# Patient Record
Sex: Female | Born: 1970 | Hispanic: Yes | Marital: Married | Smoking: Never smoker
Health system: Southern US, Community
[De-identification: ages and names within clinical notes are randomized; demographics above are authoritative.]

## PROBLEM LIST (undated history)

## (undated) DIAGNOSIS — I10 Essential (primary) hypertension: Secondary | ICD-10-CM

## (undated) DIAGNOSIS — Z87442 Personal history of urinary calculi: Secondary | ICD-10-CM

---

## 2000-09-15 ENCOUNTER — Ambulatory Visit (HOSPITAL_COMMUNITY): Admission: RE | Admit: 2000-09-15 | Discharge: 2000-09-15 | Payer: Self-pay | Admitting: *Deleted

## 2000-10-18 ENCOUNTER — Inpatient Hospital Stay (HOSPITAL_COMMUNITY): Admission: AD | Admit: 2000-10-18 | Discharge: 2000-10-18 | Payer: Self-pay | Admitting: *Deleted

## 2000-10-21 ENCOUNTER — Ambulatory Visit (HOSPITAL_COMMUNITY): Admission: RE | Admit: 2000-10-21 | Discharge: 2000-10-21 | Payer: Self-pay | Admitting: *Deleted

## 2000-10-25 ENCOUNTER — Emergency Department (HOSPITAL_COMMUNITY): Admission: EM | Admit: 2000-10-25 | Discharge: 2000-10-25 | Payer: Self-pay | Admitting: Emergency Medicine

## 2000-10-25 ENCOUNTER — Encounter: Payer: Self-pay | Admitting: Emergency Medicine

## 2000-12-17 ENCOUNTER — Encounter: Payer: Self-pay | Admitting: *Deleted

## 2000-12-17 ENCOUNTER — Inpatient Hospital Stay (HOSPITAL_COMMUNITY): Admission: AD | Admit: 2000-12-17 | Discharge: 2000-12-17 | Payer: Self-pay | Admitting: *Deleted

## 2000-12-18 ENCOUNTER — Inpatient Hospital Stay (HOSPITAL_COMMUNITY): Admission: AD | Admit: 2000-12-18 | Discharge: 2000-12-20 | Payer: Self-pay | Admitting: *Deleted

## 2000-12-28 ENCOUNTER — Encounter (HOSPITAL_COMMUNITY): Admission: RE | Admit: 2000-12-28 | Discharge: 2001-01-27 | Payer: Self-pay | Admitting: Obstetrics & Gynecology

## 2000-12-29 ENCOUNTER — Encounter: Admission: RE | Admit: 2000-12-29 | Discharge: 2000-12-29 | Payer: Self-pay | Admitting: Obstetrics & Gynecology

## 2001-01-04 ENCOUNTER — Encounter: Admission: RE | Admit: 2001-01-04 | Discharge: 2001-01-04 | Payer: Self-pay | Admitting: Obstetrics & Gynecology

## 2001-01-18 ENCOUNTER — Encounter: Admission: RE | Admit: 2001-01-18 | Discharge: 2001-01-18 | Payer: Self-pay | Admitting: Obstetrics & Gynecology

## 2001-01-25 ENCOUNTER — Encounter: Admission: RE | Admit: 2001-01-25 | Discharge: 2001-01-25 | Payer: Self-pay | Admitting: Obstetrics & Gynecology

## 2001-01-31 ENCOUNTER — Inpatient Hospital Stay (HOSPITAL_COMMUNITY): Admission: AD | Admit: 2001-01-31 | Discharge: 2001-02-02 | Payer: Self-pay | Admitting: Obstetrics

## 2003-02-05 ENCOUNTER — Emergency Department (HOSPITAL_COMMUNITY): Admission: EM | Admit: 2003-02-05 | Discharge: 2003-02-05 | Payer: Self-pay | Admitting: Emergency Medicine

## 2004-09-26 ENCOUNTER — Emergency Department (HOSPITAL_COMMUNITY): Admission: EM | Admit: 2004-09-26 | Discharge: 2004-09-26 | Payer: Self-pay | Admitting: Emergency Medicine

## 2006-10-17 ENCOUNTER — Encounter: Admission: RE | Admit: 2006-10-17 | Discharge: 2006-10-17 | Payer: Self-pay | Admitting: Family Medicine

## 2007-04-05 ENCOUNTER — Encounter: Admission: RE | Admit: 2007-04-05 | Discharge: 2007-04-05 | Payer: Self-pay | Admitting: Family Medicine

## 2007-10-04 ENCOUNTER — Encounter: Admission: RE | Admit: 2007-10-04 | Discharge: 2007-10-04 | Payer: Self-pay | Admitting: Internal Medicine

## 2010-07-12 ENCOUNTER — Encounter: Payer: Self-pay | Admitting: *Deleted

## 2010-11-06 NOTE — Discharge Summary (Signed)
[facility]  Patient:    Joy [name], Joy [name]            MRN: [id number] Adm. Date:  [id number] Disc. Date: [id number] Attending:  Michaelle Copas Dictator:   Jonah Blue, M.D. CC:         [facility]   Discharge Summary  DATE OF BIRTH:                03-Nov-1970  ADMISSION DIAGNOSES:          Questionable marginal separation of the placenta, breech ______, previous cesarean section x 2, vaginal bleeding.  DISCHARGE DIAGNOSES:          Questionable marginal separation of the placenta, breech ______, previous cesarean section x 2, vaginal bleeding resolved.  HISTORY:                      Patient is a 40 year old G3, P2-0-0-2 who presents at 20 and [redacted] weeks gestation with concerning vaginal bleeding and contractions, but good fetal movement.  Throughout patients hospital course vaginal bleeding ended at 8 a.m. on December 19, 2000 and has resolved since. Uterine irritability has continued, but without any further contractions. Patient is having no other symptoms and is doing well.  LABORATORIES:                 Normal CBC with white blood count 7.9, hemoglobin 12.4, platelets 242,000.  DIC panel showed PT 13.2, PTT 30, INR 1.0, fibrinogen 558, D-dimer 1.38.  PAST OBSTETRICAL HISTORY:    Birth by cesarean section in 12 in [place], indications unknown.  Birth by cesarean section in 70 in [place]. Indications for cesarean section unknown.  PAST GYNECOLOGICAL HISTORY:   Significant for frequent UTIs.  PAST SURGICAL HISTORY:        None.  PRENATAL LABORATORIES:        Blood type O+.  Antibody screen negative. Hepatitis B surface antigen negative.  Syphilis negative.  HIV negative.  GC and chlamydia negative.  GBS positive on May 9.  One hour GTT 82.  FAMILY HISTORY:               Noncontributory.  SOCIAL HISTORY:               Noncontributory.  PHYSICAL EXAMINATION  GENERAL:                      A woman in no acute  distress.  VITAL SIGNS:                  Stable.  Temperature 98.5, pulse 68, respirations 18, blood pressure 132/72.  HEART:                        Regular rate and rhythm without murmur.  LUNGS:                        Clear to auscultation bilaterally.  ABDOMEN:                      Soft, nontender with fundal height approximately 30 cm.  EXTREMITIES:                  No edema.  NEUROLOGIC:                   DTRs 1+.  PELVIC:  Speculum examination showed dark red blood swabbed from the cervix, but without any active bleeding.  Also, a thin discharge.  Digital cervical examination showed the cervix to be closed, long, and high.  Fetal heart rate at that time was in the 140s and was reactive with no variability and no decelerations.  The contractions were occasional with uterine irritability and the duration was 20-60 seconds of a mild intensity.  HOSPITAL COURSE:              VAGINAL BLEEDING:  Resolved.  No complications or infections and no further concerns developed.  CONDITION ON DISCHARGE:       Improved.  DISPOSITION:                  Patient is to go home on bed rest.  She is to avoid prolonged standing, but can get up for bathroom and meals, but should not cook or shop.  Patient is to avoid sexual intercourse.  She should also count kick counts by laying on her left side and counting 10 counts in an hour.  If she does not reach 10 counts in an hour she should get up, drink some juice, and walk around for a little while and then lay back down and attempt to count 30 counts in half an hour.  If she is unsuccessful at that she should call her physician or return to clinic.  Patient should also return to clinic immediately if vaginal bleeding recurs.  Anticipated plan is for a cesarean section scheduled at 39 weeks.  Patient is to follow-up at high risk clinic in one week.  Appointment is set for December 28, 2000 at 0830.DD:  12/20/00 TD:  12/20/00 Job:  [id number]

## 2012-09-09 ENCOUNTER — Encounter (HOSPITAL_COMMUNITY): Payer: Self-pay | Admitting: *Deleted

## 2012-09-09 ENCOUNTER — Emergency Department (HOSPITAL_COMMUNITY)
Admission: EM | Admit: 2012-09-09 | Discharge: 2012-09-09 | Disposition: A | Discharge status: Home / Self Care | Payer: No Typology Code available for payment source | Attending: Emergency Medicine | Admitting: Emergency Medicine

## 2012-09-09 ENCOUNTER — Emergency Department (HOSPITAL_COMMUNITY): Payer: No Typology Code available for payment source

## 2012-09-09 DIAGNOSIS — IMO0002 Reserved for concepts with insufficient information to code with codable children: Secondary | ICD-10-CM | POA: Insufficient documentation

## 2012-09-09 DIAGNOSIS — S0990XA Unspecified injury of head, initial encounter: Secondary | ICD-10-CM | POA: Insufficient documentation

## 2012-09-09 DIAGNOSIS — Z79899 Other long term (current) drug therapy: Secondary | ICD-10-CM | POA: Insufficient documentation

## 2012-09-09 DIAGNOSIS — T148XXA Other injury of unspecified body region, initial encounter: Secondary | ICD-10-CM | POA: Insufficient documentation

## 2012-09-09 DIAGNOSIS — S46909A Unspecified injury of unspecified muscle, fascia and tendon at shoulder and upper arm level, unspecified arm, initial encounter: Secondary | ICD-10-CM | POA: Insufficient documentation

## 2012-09-09 DIAGNOSIS — Y92410 Unspecified street and highway as the place of occurrence of the external cause: Secondary | ICD-10-CM | POA: Insufficient documentation

## 2012-09-09 DIAGNOSIS — S4980XA Other specified injuries of shoulder and upper arm, unspecified arm, initial encounter: Secondary | ICD-10-CM | POA: Insufficient documentation

## 2012-09-09 DIAGNOSIS — Y93I9 Activity, other involving external motion: Secondary | ICD-10-CM | POA: Insufficient documentation

## 2012-09-09 MED ORDER — HYDROCODONE-ACETAMINOPHEN 5-325 MG PO TABS: 1.0000 | ORAL_TABLET | ORAL | Status: DC | PRN

## 2012-09-09 MED ORDER — CYCLOBENZAPRINE HCL 5 MG PO TABS: 10.0000 mg | ORAL_TABLET | Freq: Three times a day (TID) | ORAL | Status: DC | PRN

## 2012-09-09 MED ORDER — HYDROCODONE-ACETAMINOPHEN 5-325 MG PO TABS
1.0000 | ORAL_TABLET | Freq: Once | ORAL | Status: AC
  Administered 2012-09-09: 1 via ORAL
  Filled 2012-09-09: qty 1

## 2012-09-09 MED ORDER — CYCLOBENZAPRINE HCL 10 MG PO TABS
5.0000 mg | ORAL_TABLET | Freq: Once | ORAL | Status: AC
  Administered 2012-09-09: 5 mg via ORAL
  Filled 2012-09-09: qty 1

## 2012-09-09 NOTE — ED Notes (Signed)
Patient transported to CT 

## 2012-09-09 NOTE — ED Notes (Signed)
Transporter back from CT to FT-08.
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
## 2012-09-09 NOTE — ED Notes (Signed)
Patient is alert and orientedx4.  Patient was explained discharge instructions and they understood them with no questions.  The patient's niece, Gearlean Alf is taking the patient home.

## 2012-09-11 ENCOUNTER — Emergency Department (HOSPITAL_COMMUNITY)
Admission: EM | Admit: 2012-09-11 | Discharge: 2012-09-11 | Disposition: A | Discharge status: Home / Self Care | Payer: No Typology Code available for payment source | Attending: Emergency Medicine | Admitting: Emergency Medicine

## 2012-09-11 ENCOUNTER — Encounter (HOSPITAL_COMMUNITY): Payer: Self-pay | Admitting: Emergency Medicine

## 2012-09-11 DIAGNOSIS — Y9389 Activity, other specified: Secondary | ICD-10-CM | POA: Insufficient documentation

## 2012-09-11 DIAGNOSIS — Z79899 Other long term (current) drug therapy: Secondary | ICD-10-CM | POA: Insufficient documentation

## 2012-09-11 DIAGNOSIS — Y92410 Unspecified street and highway as the place of occurrence of the external cause: Secondary | ICD-10-CM | POA: Insufficient documentation

## 2012-09-11 DIAGNOSIS — M542 Cervicalgia: Secondary | ICD-10-CM

## 2012-09-11 DIAGNOSIS — S0993XA Unspecified injury of face, initial encounter: Secondary | ICD-10-CM | POA: Insufficient documentation

## 2012-09-11 DIAGNOSIS — S199XXA Unspecified injury of neck, initial encounter: Secondary | ICD-10-CM | POA: Insufficient documentation

## 2012-09-11 MED ORDER — KETOROLAC TROMETHAMINE 60 MG/2ML IM SOLN
60.0000 mg | Freq: Once | INTRAMUSCULAR | Status: AC
  Administered 2012-09-11: 60 mg via INTRAMUSCULAR
  Filled 2012-09-11: qty 2

## 2012-09-11 MED ORDER — DIAZEPAM 5 MG PO TABS
5.0000 mg | ORAL_TABLET | Freq: Once | ORAL | Status: AC
  Administered 2012-09-11: 5 mg via ORAL
  Filled 2012-09-11: qty 1
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
## 2012-09-12 NOTE — ED Provider Notes (Signed)
Medical screening examination/treatment/procedure(s) were performed by non-physician practitioner and as supervising physician I was immediately available for consultation/collaboration.  Flint Melter, MD 09/12/12 Ebony Cargo
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
## 2012-09-21 NOTE — ED Provider Notes (Signed)
Medical screening examination/treatment/procedure(s) were performed by non-physician practitioner and as supervising physician I was immediately available for consultation/collaboration.  Sunnie Nielsen, MD 09/21/12 2258
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
## 2019-07-26 ENCOUNTER — Ambulatory Visit (INDEPENDENT_AMBULATORY_CARE_PROVIDER_SITE_OTHER): Payer: Self-pay | Admitting: Women's Health

## 2019-07-26 ENCOUNTER — Other Ambulatory Visit: Payer: Self-pay

## 2019-07-26 ENCOUNTER — Encounter: Payer: Self-pay | Admitting: Women's Health

## 2019-07-26 VITALS — BP 122/80 | Ht 62.0 in | Wt 173.0 lb

## 2019-07-26 DIAGNOSIS — N83201 Unspecified ovarian cyst, right side: Secondary | ICD-10-CM

## 2019-07-26 MED ORDER — NORETHIN ACE-ETH ESTRAD-FE 1-20 MG-MCG PO TABS: 1.0000 | ORAL_TABLET | Freq: Every day | ORAL | 12 refills | Status: DC

## 2019-07-26 MED ORDER — NORGESTIMATE-ETH ESTRADIOL 0.25-35 MG-MCG PO TABS: 1.0000 | ORAL_TABLET | Freq: Every day | ORAL | 11 refills | Status: DC

## 2019-07-26 NOTE — Patient Instructions (Addendum)
Vit D 2000 iu daily [place]  daugter to get Gardasil  Quiste ovrico (Ovarian Cyst) Un quiste ovrico es una bolsa llena de lquido que se forma en el ovario. Los ovarios son los rganos pequeos que producen vulos en las mujeres. Se pueden formar varios tipos de [employer]. Algunos pueden provocar sntomas y requerir Clinical research associate. La mayora de los quistes ovricos desaparecen por s solos, no son cancerosos (son benignos) y no causan problemas. Los tipos ms comunes de quistes ovricos son los siguientes:  Quistes funcionales (folculos). ? Ocurren durante el ciclo menstrual y suelen desaparecer con el siguiente ciclo menstrual si no queda embarazada. ? No suelen causar sntomas.  Endometriomas. ? Son quistes formados por el tejido que recubre el tero (endometrio). ? A veces se denominan "quistes de chocolate" porque se llenan de sangre que se vuelve marrn. ? Este tipo de quiste puede provocar dolor en la zona inferior del abdomen durante la relacin sexual y el perodo menstrual.  Cistoadenomas. ? Se desarrollan a partir de clulas que se encuentran en la superficie externa del ovario. ? Pueden agrandarse mucho y causar dolor en la zona inferior del abdomen y con la relacin sexual. ? Pueden provocar dolor intenso si se tuercen o se rompen (ruptura).  Quistes dermoides. ? A veces se encuentran en ambos ovarios. ? Estos quistes pueden [employer]J's tipos de tejidos del organismo, como piel, dientes, pelo o Database administrator. ? Generalmente no tienen sntomas, a menos que sean muy grandes.  Quistes tecalutesticos. ? Aparecen cuando se produce demasiada cantidad de cierta hormona (gonadotropina corinica humana) que estimula en exceso al ovario para que produzca vulos. ? Son ms frecuentes despus de procedimientos que ayudan a la concepcin de un beb (fertilizacin in vitro). CAUSAS Algunas de las causas de los quistes ovricos son las siguientes:  Sndrome de  hiperestimulacin ovrica. Esta es una afeccin que puede aparecer debido a la toma de medicamentos para la fertilidad. Provoca la formacin de varios quistes ovricos grandes.  Sndrome de ovario poliqustico (SOP). Este es un trastorno hormonal frecuente que puede producir quistes ovricos, adems de problemas en el perodo o la fertilidad. [place] factores pueden hacer que usted sea ms propensa a desarrollar quistes ovricos:  Tener exceso de [place] u obesidad.  Tomar medicamentos para la fertilidad.  Usar ciertas formas de regulacin hormonal de la natalidad.  Fumar. SNTOMAS Muchos quistes ovricos no causan sntomas. Si se presentan sntomas, estos pueden ser:  Dolor o molestias en la pelvis.  Dolor en la parte baja del abdomen.  [place].  [place]inch[place]n abdominal.  Perodos menstruales anormales.  Aumento del [employer] perodos [place]. DIAGNSTICO Estos quistes se descubren comnmente durante un examen de rutina o una exploracin ginecolgica. Puede realizarse exmenes para obtener ms informacin sobre el [place], como los siguientes:  Magazine features editor.  Radiografas de la pelvis.  Tomografa computarizada (TC).  Resonancia magntica (RM).  Anlisis de [place]. TRATAMIENTO Muchos de los quistes ovricos desaparecen por s solos, sin tratamiento. Es probable que el mdico quiera controlar el quiste regularmente durante 2 o 63meses para ver si se produce algn cambio. Si est en la menopausia, es especialmente importante controlar el quiste cuidadosamente porque las mujeres menopusicas tienen un ndice mayor de cncer de ovario. Si se necesita tratamiento, este puede incluir lo siguiente:  Tomar medicamentos para Best boy.  Un procedimiento para drenar el quiste (aspiracin).  Ciruga para extirpar el quiste completo.  Tratamiento hormonal o pldoras  anticonceptivas. Estos mtodos a veces se usan para  ayudar a Writer. [place] los medicamentos de venta libre y los recetados solamente como se lo haya indicado el mdico.  No conduzca ni use maquinaria pesada mientras toma analgsicos recetados.  Realcese exmenes plvicos peridicos y pruebas de Papanicolaou con la frecuencia que le indique el mdico.  Retome sus actividades normales como se lo haya indicado el mdico. Pregntele al mdico qu actividades son seguras para usted.  No consuma ningn producto que contenga tabaco o nicotina, como cigarrillos y Psychologist, sport and exercise. Si necesita ayuda para dejar de fumar, consulte al mdico.  Concurra a todas las visitas de control como se lo haya indicado el mdico. Esto es importante. SOLICITE ATENCIN MDICA SI:  Los perodos se atrasan, son irregulares, dolorosos o cesan.  Tiene dolor plvico que no desaparece.  Siente presin en la vejiga o no puede vaciarla completamente.  Siente dolor durante las [employer].  Tiene alguno de los siguientes sntomas en el abdomen: ? Sensacin de Environmental health practitioner lleno. ? Presin. ? Molestias. ? Dolor que persiste. ? Hinchazn.  Siente un Pharmacist, hospital.  Tiene estreimiento.  Pierde el apetito.  Presenta acn grave.  Empieza a tener ms bello corporal y facial.  Aumenta o disminuye de peso sin hacer modificaciones en su actividad fsica y en su dieta habitual.  Cree que puede estar [place]. SOLICITE ATENCIN MDICA DE INMEDIATO SI:  Tiene un dolor abdominal intenso o que empeora.  No puede comer ni beber sin vomitar.  Tiene fiebre de [place] repentina.  Su perodo menstrual es mucho ms profuso que lo normal. Esta informacin no tiene Marine scientist el consejo del mdico. Asegrese de hacerle al mdico cualquier pregunta que tenga. Document Revised: 02/12/2016 Document Reviewed: 11/09/2015 Elsevier Patient Education  2020 [employer].

## 2019-07-26 NOTE — Progress Notes (Signed)
49 year old M HF G3, P3 Spanish-speaking, interpreter Estill Bamberg on phone line interpreter.  Patient had annual exam 07/06/2019 had a normal Pap, normal mammogram, had an ultrasound due to discomfort with pelvic exam that showed multiple fibroids largest being 3 cm, uterus 8.4 x 5.4 x 5.6 endometrial stripe 7 mm.  Left ovary not visible, right ovary large measuring 6 x 5.5 x 4.5 cm within the right ovary is a 5.3 cm complex mass.  Presents today to discuss the complex mass and what is needed to be done.  Has been on Tri-Sprintec with regular monthly cycle for greater than 15 years. Denies any menopausal symptoms, vaginal discharge, urinary symptoms, change in bowel elimination or fever.  Reports occasional abdominal/back discomfort with activity and intercourse.  Reports always normal Pap and mammogram history.  Works at a job with physical activity with some lifting, includes pushing a cart which sometimes aggravates the discomfort.  Reports all normal labs at primary care including blood sugar and CBC.  Exam: Appears well, comfortable.  Heart regular rate and rhythm, lungs clear throughout, breast examined sitting and lying position within normal limits without retractions, dimpling, nipple discharge or palpable masses.  Abdomen soft, slight tenderness at suprapubic area.  External genitalia within normal limits, no discharge, erythema or odor noted, speculum exam vaginal walls healthy without discharge, bimanual no CMT, slight tenderness in both right and left adnexa.  5.3 complex right ovarian mass Fibroid uterus  Plan: CA-125, repeat ultrasound in 5 weeks after next cycle if CA-125 is low.  Finish out current pack of OCs and change to Loestrin 1/20 p.o. daily.  Agreeable with plan.  Reviewed fibroids are benign/noncancerous, ovarian mass may dissipate after next cycle. 35 minutes with interpreter, reviewing records, exam and explaining plan of care with patient.

## 2019-07-27 LAB — CA 125: CA 125: 9 U/mL (ref <35)

## 2019-07-30 NOTE — Progress Notes (Signed)
Patient informed lab result and she will call on 1st day of cycle for [place] to schedule ultrasound a week after per Izora Gala.

## 2019-08-21 ENCOUNTER — Other Ambulatory Visit: Payer: Self-pay

## 2019-08-23 ENCOUNTER — Ambulatory Visit (INDEPENDENT_AMBULATORY_CARE_PROVIDER_SITE_OTHER): Payer: Self-pay

## 2019-08-23 ENCOUNTER — Other Ambulatory Visit: Payer: Self-pay

## 2019-08-23 DIAGNOSIS — N83201 Unspecified ovarian cyst, right side: Secondary | ICD-10-CM

## 2019-09-19 ENCOUNTER — Other Ambulatory Visit: Payer: Self-pay

## 2019-09-20 ENCOUNTER — Encounter: Payer: Self-pay | Admitting: Obstetrics & Gynecology

## 2019-09-20 ENCOUNTER — Ambulatory Visit (INDEPENDENT_AMBULATORY_CARE_PROVIDER_SITE_OTHER): Payer: Self-pay | Admitting: Obstetrics & Gynecology

## 2019-09-20 VITALS — BP 140/84

## 2019-09-20 DIAGNOSIS — N83201 Unspecified ovarian cyst, right side: Secondary | ICD-10-CM

## 2019-09-20 DIAGNOSIS — R102 Pelvic and perineal pain: Secondary | ICD-10-CM

## 2019-09-20 DIAGNOSIS — D251 Intramural leiomyoma of uterus: Secondary | ICD-10-CM

## 2019-09-20 NOTE — Progress Notes (Addendum)
Joy [name] Nov 18, 1970 [id number]   History:    49 y.o. G3P3L3 Married  RP:  Established patient presenting for Fibroids/Rt complex ovarian cyst/Pelvic pain  HPI: On the generic of LoEstrin Fe 1/20 with normal withdrawal bleeding monthly.  No BTB.  Worsening pelvic discomfort and pain.  Pelvic US 08/23/2019 showed a persistent complex Rt Ovarian Cyst at 5.6 cm c/w a Dermoid and Uterine IM Fibroids, the largest at 2.5 cm, the endometrial line was normal.  The Pelvic US findings were stable c/w the Pelvic US done 06/2019.  Ca 125 normal at 9 on 07/26/2019.  Urine/BMs normal.  Pap test 06/2019 Negative.  Past medical history,surgical history, family history and social history were all reviewed and documented in the EPIC chart.  Gynecologic History Patient's last menstrual period was 09/18/2019.  Obstetric History OB History  Gravida Para Term Preterm AB Living  3       0 3  SAB TAB Ectopic Multiple Live Births      0        # Outcome Date GA Lbr Len/2nd Weight Sex Delivery Anes PTL Lv  3 Gravida           2 Gravida           1 Gravida              ROS: A ROS was performed and pertinent positives and negatives are included in the history.  GENERAL: No fevers or chills. HEENT: No change in vision, no earache, sore throat or sinus congestion. NECK: No pain or stiffness. CARDIOVASCULAR: No chest pain or pressure. No palpitations. PULMONARY: No shortness of breath, cough or wheeze. GASTROINTESTINAL: No abdominal pain, nausea, vomiting or diarrhea, melena or bright red blood per rectum. GENITOURINARY: No urinary frequency, urgency, hesitancy or dysuria. MUSCULOSKELETAL: No joint or muscle pain, no back pain, no recent trauma. DERMATOLOGIC: No rash, no itching, no lesions. ENDOCRINE: No polyuria, polydipsia, no heat or cold intolerance. No recent change in weight. HEMATOLOGICAL: No anemia or easy bruising or bleeding. NEUROLOGIC: No headache, seizures, numbness, tingling or weakness.  PSYCHIATRIC: No depression, no loss of interest in normal activity or change in sleep pattern.     Exam:   BP 140/84   LMP 09/18/2019   There is no height or weight on file to calculate BMI.  General appearance : Well developed well nourished female. No acute distress  Pelvic: Deferred  Pelvic US 08/23/2019: Anteverted uterus irregular contour with multiple intramural and subserous fibroids as listed.  Fibroids   1.9 x 1.7 cm., 2.5 x 1.7 cm.,  1.1 x 1.2 cm.,  2.2 x 1.7 cm.,  1.13 x 1.3 cm.,  0.8 x 0.8 cm, 0.96 x 0.88 cm, 0.9 x 0.9 cm,.  Thin endometrial stripe 3.4 cm with no masses or thickening seen.  Left ovary normal size 13 x 12 mm simple follicle.  Right adnexal mass 5.6 x 5.1 x 5.1 cm mixed echogenic mass with solid components which shadow appearance consistent with dermoid.  No free fluid.  Tender vaginal exam bilaterally.  Ca125 at 9 on July 26, 2019   Assessment/Plan:  49 y.o. female for annual exam   1. Cyst of right ovary Persistent right complex ovarian cyst compatible with a dermoid with CA-125 normal at 9.  Uterine fibroids with pelvic pain.  Management options reviewed thoroughly with patient.  Decision to proceed with an XI robotic total laparoscopy hysterectomy, bilateral salpingectomy with right oophorectomy and peritoneal washings.  Surgery and  risks reviewed, and formation pamphlet given.  Will organize the surgery and patient will follow up with a preop tele-visit.  2. Female pelvic pain As above.  3. Fibroids, intramural Many fibroids present, the largest is measured at 2.5 cm.  Princess Bruins MD, 8:17 AM 09/20/2019

## 2019-09-21 ENCOUNTER — Encounter: Payer: Self-pay | Admitting: Obstetrics & Gynecology

## 2019-09-21 NOTE — Patient Instructions (Signed)
1. Cyst of right ovary Persistent right complex ovarian cyst compatible with a dermoid with CA-125 normal at 9.  Uterine fibroids with pelvic pain.  Management options reviewed thoroughly with patient.  Decision to proceed with an XI robotic total laparoscopy hysterectomy, bilateral salpingectomy with right oophorectomy and peritoneal washings.  Surgery and risks reviewed, and formation pamphlet given.  Will organize the surgery and patient will follow up with a preop tele-visit.  2. Female pelvic pain As above.  3. Fibroids, intramural Many fibroids present, the largest is measured at 2.5 cm.  Joy [name], it was a pleasure seeing you today!

## 2019-09-25 ENCOUNTER — Telehealth: Payer: Self-pay

## 2019-09-25 NOTE — Telephone Encounter (Signed)
Joy [name] called this Spanish speaking patient and relayed the below to her. "With her 55% private pay discount her estimated [facility] surgery preymt amount due by one week before surgery in $1544.00 (includes assistant surgeon's estimated fee as well.).   The ONLY available date I currently have for robot. (We get two block mornings a month and Dr. Dellis Filbert is out of office for one of her's in June.) is Tuesday, June 29 at 12:00am at [facility].   She will need pre op with Dr. Marguerita Merles and video visit is fine if she wants.   She will need to go for Covid testing drive thru at old [facility] hospital on Friday June 25 between 9 and 2:30pm. Let me know what time she prefers and I will schedule appointment as close to that as I can and I will send her a sheet about Covid screen with date/time at the top. Will also mail a financial letter and [facility] pamphlet. "  Joy [name] notified me that the 6/29 date at noon is fine with the patient.  Patient can go anytime on 6/25 for her Covid test. I will scheduled accordingly and mail her a sheet with date/time.  Packet will be sent. Surgery scheduled and pre op appt with ML scheduled.

## 2019-12-04 ENCOUNTER — Other Ambulatory Visit: Payer: Self-pay

## 2019-12-04 ENCOUNTER — Telehealth (INDEPENDENT_AMBULATORY_CARE_PROVIDER_SITE_OTHER): Payer: Self-pay | Admitting: Obstetrics & Gynecology

## 2019-12-04 ENCOUNTER — Encounter: Payer: Self-pay | Admitting: Obstetrics & Gynecology

## 2019-12-04 DIAGNOSIS — D251 Intramural leiomyoma of uterus: Secondary | ICD-10-CM

## 2019-12-04 DIAGNOSIS — N83201 Unspecified ovarian cyst, right side: Secondary | ICD-10-CM

## 2019-12-04 DIAGNOSIS — R102 Pelvic and perineal pain: Secondary | ICD-10-CM

## 2019-12-04 NOTE — Progress Notes (Signed)
Joy [name] 03/17/1971 [id number]        49 y.o.  G3P3L3 Married.  Televisit by video.  Verbal informed consent obtained.  Patient well identified.  Preop counseling done between patient, in her home with her husband and daughter, and myself at my [place] office.  Preop counseling for 25 minutes.  Daughter present for translation.  RP: Preop for XI Robotic TLH/RSO   HPI: No change x last visit: On the generic of LoEstrin Fe 1/20 with normal withdrawal bleeding monthly.  No BTB.  Worsening pelvic discomfort and pain.  Pelvic US 08/23/2019 showed a persistent complex Rt Ovarian Cyst at 5.6 cm c/w a Dermoid and Uterine IM Fibroids, the largest at 2.5 cm, the endometrial line was normal.  The Pelvic US findings were stable c/w the Pelvic US done 06/2019.  Ca 125 normal at 9 on 07/26/2019.  Urine/BMs normal.  Pap test 06/2019 Negative.   OB History  Gravida Para Term Preterm AB Living  3       0 3  SAB TAB Ectopic Multiple Live Births      0        # Outcome Date GA Lbr Len/2nd Weight Sex Delivery Anes PTL Lv  3 Gravida           2 Gravida           1 Gravida             Past medical history,surgical history, problem list, medications, allergies, family history and social history were all reviewed and documented in the EPIC chart.   Directed ROS with pertinent positives and negatives documented in the history of present illness/assessment and plan.  Exam:  There were no vitals filed for this visit. General appearance:  Normal  Televisit  Pelvic US 08/23/2019: Anteverted uterus irregular contour with multiple intramural and subserous fibroids as listed.  Fibroids   1.9 x 1.7 cm., 2.5 x 1.7 cm.,  1.1 x 1.2 cm.,  2.2 x 1.7 cm.,  1.13 x 1.3 cm.,  0.8 x 0.8 cm, 0.96 x 0.88 cm, 0.9 x 0.9 cm,.  Thin endometrial stripe 3.4 cm with no masses or thickening seen.  Left ovary normal size 13 x 12 mm simple follicle.  Right adnexal mass 5.6 x 5.1 x 5.1 cm mixed echogenic mass with solid  components which shadow appearance consistent with dermoid.  No free fluid.  Tender vaginal exam bilaterally.   Assessment/Plan:  49 y.o. G3P0003   1. Fibroids, intramural Many Fibroids, largest 2.5 cm.  2. Cyst of right ovary Persistent right complex ovarian cyst compatible with a dermoid with CA-125 normal at 9.  Uterine fibroids with pelvic pain.  Management options reviewed thoroughly with patient.  Decision to proceed with an XI robotic total laparoscopy hysterectomy, bilateral salpingectomy with right oophorectomy and peritoneal washings.  Surgery and risks thoroughly reviewed, information pamphlet given.  3. Female pelvic pain As above  Other orders - fluticasone (FLONASE) 50 MCG/ACT nasal spray; Place into both nostrils daily.                        Patient was counseled as to the risk of surgery to include the following:  1. Infection (prohylactic antibiotics will be administered)  2. DVT/Pulmonary Embolism (prophylactic pneumo compression stockings will be used)  3.Trauma to internal organs requiring additional surgical procedure to repair any injury to internal organs requiring perhaps additional hospitalization days.  4.Hemmorhage requiring transfusion and blood  products which carry risks such as anaphylactic reaction, hepatitis and AIDS  Patient had received literature information on the procedure scheduled and all her questions were answered and fully accepts all risk.   Princess Bruins MD, 3:04 PM 12/04/2019

## 2019-12-06 ENCOUNTER — Encounter (HOSPITAL_COMMUNITY)
Admission: RE | Admit: 2019-12-06 | Discharge: 2019-12-06 | Disposition: A | Discharge status: Home / Self Care | Payer: Self-pay | Source: Ambulatory Visit | Attending: Obstetrics & Gynecology | Admitting: Obstetrics & Gynecology

## 2019-12-06 ENCOUNTER — Other Ambulatory Visit: Payer: Self-pay

## 2019-12-06 ENCOUNTER — Encounter (HOSPITAL_COMMUNITY): Payer: Self-pay

## 2019-12-06 DIAGNOSIS — Z01818 Encounter for other preprocedural examination: Secondary | ICD-10-CM | POA: Insufficient documentation

## 2019-12-06 HISTORY — DX: Essential (primary) hypertension: I10

## 2019-12-06 HISTORY — DX: Personal history of urinary calculi: Z87.442

## 2019-12-06 LAB — CBC
HCT: 41.6 % (ref 36.0–46.0)
Hemoglobin: 13.6 g/dL (ref 12.0–15.0)
MCH: 31.1 pg (ref 26.0–34.0)
MCHC: 32.7 g/dL (ref 30.0–36.0)
MCV: 95 fL (ref 80.0–100.0)
Platelets: 291 10*3/uL (ref 150–400)
RBC: 4.38 MIL/uL (ref 3.87–5.11)
RDW: 13.1 % (ref 11.5–15.5)
WBC: 5.7 10*3/uL (ref 4.0–10.5)
nRBC: 0 % (ref 0.0–0.2)

## 2019-12-06 LAB — TYPE AND SCREEN
ABO/RH(D): O POS
Antibody Screen: NEGATIVE

## 2019-12-06 NOTE — Patient Instructions (Addendum)
DUE TO COVID-19 ONLY ONE VISITOR IS ALLOWED IN WAITING ROOM (VISITOR WILL HAVE A TEMPERATURE CHECK ON ARRIVAL AND MUST WEAR A FACE MASK THE ENTIRE TIME.)  ONCE YOU ARE ADMITTED TO YOUR PRIVATE ROOM, THE SAME ONE VISITOR IS ALLOWED TO VISIT DURING VISITING HOURS ONLY.  Your COVID swab testing is scheduled for: 12/14/19  at  12:00 pm  , You must self quarantine after your testing per handout given to you at the testing site.  ([place] up testing enter pre-surgical testing line)    Your procedure is scheduled on:12/18/19  Report to [place] AT: 10:00 A. M.   Call this number if you have problems the morning of surgery:[phone].   OUR ADDRESS IS [place].  WE ARE LOCATED IN THE [place]                                   [facility].                                     REMEMBER:   DO NOT EAT SOLID FOOD AFTER MIDNIGHT .CLEAR LIQUIDS FROM MIDNIGHT UNTIL 9:00 AM.      CLEAR LIQUID DIET   Foods Allowed                                                                     Foods Excluded  Coffee and tea, regular and decaf                             liquids that you cannot  Plain Jell-O any favor except red or purple                                           see through such as: Fruit ices (not with fruit pulp)                                     milk, soups, orange juice  Iced Popsicles                                    All solid food Carbonated beverages, regular and diet                                    Cranberry, grape and apple juices Sports drinks like Gatorade Lightly seasoned clear broth or consume(fat free) Sugar, honey syrup  Sample Menu Breakfast                                Lunch  Supper Cranberry juice                    Beef broth                            Chicken broth Jell-O                                     Grape juice                            Apple juice Coffee or tea                        Jell-O                                      Popsicle                                                Coffee or tea                        Coffee or tea  _____________________________________________________________________   BRUSH YOUR TEETH THE MORNING OF SURGERY.  TAKE THESE MEDICATIONS MORNING OF SURGERY WITH A SIP OF WATER:    DO NOT WEAR JEWERLY, MAKE UP, OR NAIL POLISH.  DO NOT WEAR LOTIONS, POWDERS, PERFUMES/COLOGNE OR DEODORANT.  DO NOT SHAVE FOR 24 HOURS PRIOR TO DAY OF SURGERY.  MEN MAY SHAVE FACE AND NECK.  CONTACTS, GLASSES, OR DENTURES MAY NOT BE WORN TO SURGERY.                                    [facility] IS NOT RESPONSIBLE  FOR ANY BELONGINGS.          BRING ALL PRESCRIPTION MEDICATIONS WITH YOU THE DAY OF SURGERY IN ORIGINAL CONTAINERS                                YOU MAY BRING A SMALL OVERNIGHT BAG                                  .[facility] - Preparing for Surgery Before surgery, you can play an important role.  Because skin is not sterile, your skin needs to be as free of germs as possible.  You can reduce the number of germs on your skin by washing with CHG (chlorahexidine gluconate) soap before surgery.  CHG is an antiseptic cleaner which kills germs and bonds with the skin to continue killing germs even after washing. Please DO NOT use if you have an allergy to CHG or antibacterial soaps.  If your skin becomes reddened/irritated stop using the CHG and inform your nurse when you arrive at Short Stay. Do not shave (including legs and underarms) for at least 48 hours prior to the first CHG shower.  You may  shave your face/neck. Please follow these instructions carefully:  1.  Shower with CHG Soap the night before surgery and the  morning of Surgery.  2.  If you choose to wash your hair, wash your hair first as usual with your  normal  shampoo.  3.  After you shampoo, rinse your hair and body thoroughly to  remove the  shampoo.                           4.  Use CHG as you would any other liquid soap.  You can apply chg directly  to the skin and wash                       Gently with a scrungie or clean washcloth.  5.  Apply the CHG Soap to your body ONLY FROM THE NECK DOWN.   Do not use on face/ open                           Wound or open sores. Avoid contact with eyes, ears mouth and genitals (private parts).                       Wash face,  Genitals (private parts) with your normal soap.             6.  Wash thoroughly, paying special attention to the area where your surgery  will be performed.  7.  Thoroughly rinse your body with warm water from the neck down.  8.  DO NOT shower/wash with your normal soap after using and rinsing off  the CHG Soap.                9.  Pat yourself dry with a clean towel.            10.  Wear clean pajamas.            11.  Place clean sheets on your bed the night of your first shower and do not  sleep with pets. Day of Surgery : Do not apply any lotions/deodorants the morning of surgery.  Please wear clean clothes to the hospital/surgery center.  FAILURE TO FOLLOW THESE INSTRUCTIONS MAY RESULT IN THE CANCELLATION OF YOUR SURGERY PATIENT SIGNATURE_________________________________  NURSE SIGNATURE__________________________________  ________________________________________________________________________

## 2019-12-06 NOTE — Progress Notes (Signed)
COVID Vaccine Completed:yes Date COVID Vaccine completed:08/29/19 COVID vaccine manufacturer: [employer]   PCP -  Facilities manager PA Cardiologist - No  Chest x-ray -  EKG -  Stress Test -  ECHO -  Cardiac Cath -   Sleep Study -  CPAP -   Fasting Blood Sugar -  Checks Blood Sugar _____ times a day  Blood Thinner Instructions: Aspirin Instructions: Last Dose:  Anesthesia review:   Patient denies shortness of breath, fever, cough and chest pain at PAT appointment   Patient verbalized understanding of instructions that were given to them at the PAT appointment. Patient was also instructed that they will need to review over the PAT instructions again at home before surgery.

## 2019-12-07 LAB — ABO/RH: ABO/RH(D): O POS

## 2019-12-10 ENCOUNTER — Encounter: Payer: Self-pay | Admitting: Obstetrics & Gynecology

## 2019-12-14 ENCOUNTER — Other Ambulatory Visit (HOSPITAL_COMMUNITY)
Admission: RE | Admit: 2019-12-14 | Discharge: 2019-12-14 | Disposition: A | Discharge status: Home / Self Care | Payer: Self-pay | Source: Ambulatory Visit | Attending: Obstetrics & Gynecology | Admitting: Obstetrics & Gynecology

## 2019-12-14 DIAGNOSIS — Z20822 Contact with and (suspected) exposure to covid-19: Secondary | ICD-10-CM | POA: Insufficient documentation

## 2019-12-14 DIAGNOSIS — Z01812 Encounter for preprocedural laboratory examination: Secondary | ICD-10-CM | POA: Insufficient documentation

## 2019-12-14 LAB — SARS CORONAVIRUS 2 (TAT 6-24 HRS): SARS Coronavirus 2: NEGATIVE

## 2019-12-17 DIAGNOSIS — Z9889 Other specified postprocedural states: Secondary | ICD-10-CM

## 2019-12-17 MED ORDER — LACTATED RINGERS IV SOLN
INTRAVENOUS | Status: DC
  Filled 2019-12-17: qty 1000

## 2019-12-17 MED ORDER — CEFAZOLIN SODIUM-DEXTROSE 2-4 GM/100ML-% IV SOLN
2.0000 g | INTRAVENOUS | Status: AC
  Administered 2019-12-18: 2 g via INTRAVENOUS
  Filled 2019-12-17: qty 100

## 2019-12-17 NOTE — Anesthesia Preprocedure Evaluation (Addendum)
Anesthesia Evaluation  Patient identified by MRN, date of birth, ID band Patient awake    Reviewed: Allergy & Precautions, NPO status , Patient's Chart, lab work & pertinent test results  Airway Mallampati: II  TM Distance: >3 FB Neck ROM: Full    Dental no notable dental hx. (+) Teeth Intact, Dental Advisory Given   Pulmonary neg pulmonary ROS,    Pulmonary exam normal breath sounds clear to auscultation       Cardiovascular hypertension, Pt. on medications Normal cardiovascular exam Rhythm:Regular Rate:Normal     Neuro/Psych negative neurological ROS  negative psych ROS   GI/Hepatic negative GI ROS, Neg liver ROS,   Endo/Other  negative endocrine ROS  Renal/GU No results found for: CREATININE, BUN, NA, K, CL, CO2      Musculoskeletal negative musculoskeletal ROS (+)   Abdominal (+) + obese,   Peds  Hematology Lab Results      Component                Value               Date                      WBC                      5.7                 12/06/2019                HGB                      13.6                12/06/2019                HCT                      41.6                12/06/2019                MCV                      95.0                12/06/2019                PLT                      291                 12/06/2019              Anesthesia Other Findings   Reproductive/Obstetrics                            Anesthesia Physical Anesthesia Plan  ASA: II  Anesthesia Plan: General   Post-op Pain Management:    Induction: Intravenous  PONV Risk Score and Plan: 4 or greater and Treatment may vary due to age or medical condition, Midazolam, Ondansetron and Dexamethasone  Airway Management Planned: Oral ETT  Additional Equipment: None  Intra-op Plan:   Post-operative Plan: Extubation in OR  Informed Consent: I have reviewed the patients History and Physical, chart,  labs and discussed the procedure including the risks,  benefits and alternatives for the proposed anesthesia with the patient or authorized representative who has indicated his/her understanding and acceptance.     Dental advisory given  Plan Discussed with: CRNA  Anesthesia Plan Comments: (GA w lidocaine + dexemedetomidine)      Anesthesia Quick Evaluation

## 2019-12-18 ENCOUNTER — Ambulatory Visit (HOSPITAL_BASED_OUTPATIENT_CLINIC_OR_DEPARTMENT_OTHER): Payer: Self-pay | Admitting: Anesthesiology

## 2019-12-18 ENCOUNTER — Encounter (HOSPITAL_BASED_OUTPATIENT_CLINIC_OR_DEPARTMENT_OTHER): Payer: Self-pay | Admitting: Obstetrics & Gynecology

## 2019-12-18 ENCOUNTER — Encounter (HOSPITAL_BASED_OUTPATIENT_CLINIC_OR_DEPARTMENT_OTHER)
Admission: RE | Disposition: A | Discharge status: Home / Self Care | Payer: Self-pay | Source: Home / Self Care | Attending: Obstetrics & Gynecology

## 2019-12-18 ENCOUNTER — Ambulatory Visit (HOSPITAL_BASED_OUTPATIENT_CLINIC_OR_DEPARTMENT_OTHER)
Admission: RE | Admit: 2019-12-18 | Discharge: 2019-12-19 | Disposition: A | Discharge status: Home / Self Care | Payer: Self-pay | Attending: Obstetrics & Gynecology | Admitting: Obstetrics & Gynecology

## 2019-12-18 DIAGNOSIS — N800 Endometriosis of uterus: Secondary | ICD-10-CM | POA: Insufficient documentation

## 2019-12-18 DIAGNOSIS — D270 Benign neoplasm of right ovary: Secondary | ICD-10-CM | POA: Insufficient documentation

## 2019-12-18 DIAGNOSIS — N83201 Unspecified ovarian cyst, right side: Secondary | ICD-10-CM

## 2019-12-18 DIAGNOSIS — R102 Pelvic and perineal pain: Secondary | ICD-10-CM

## 2019-12-18 DIAGNOSIS — D251 Intramural leiomyoma of uterus: Secondary | ICD-10-CM | POA: Insufficient documentation

## 2019-12-18 DIAGNOSIS — D259 Leiomyoma of uterus, unspecified: Secondary | ICD-10-CM

## 2019-12-18 DIAGNOSIS — Z9889 Other specified postprocedural states: Secondary | ICD-10-CM

## 2019-12-18 DIAGNOSIS — I10 Essential (primary) hypertension: Secondary | ICD-10-CM | POA: Insufficient documentation

## 2019-12-18 DIAGNOSIS — Z793 Long term (current) use of hormonal contraceptives: Secondary | ICD-10-CM | POA: Insufficient documentation

## 2019-12-18 HISTORY — PX: XI ROBOTIC ASSISTED OOPHORECTOMY: SHX6823

## 2019-12-18 HISTORY — PX: ROBOTIC ASSISTED TOTAL HYSTERECTOMY WITH BILATERAL SALPINGO OOPHERECTOMY: SHX6086

## 2019-12-18 LAB — CBC
HCT: 40.5 % (ref 36.0–46.0)
Hemoglobin: 13.5 g/dL (ref 12.0–15.0)
MCH: 31.3 pg (ref 26.0–34.0)
MCHC: 33.3 g/dL (ref 30.0–36.0)
MCV: 93.8 fL (ref 80.0–100.0)
Platelets: 235 10*3/uL (ref 150–400)
RBC: 4.32 MIL/uL (ref 3.87–5.11)
RDW: 12.8 % (ref 11.5–15.5)
WBC: 12.6 10*3/uL — ABNORMAL HIGH (ref 4.0–10.5)
nRBC: 0 % (ref 0.0–0.2)

## 2019-12-18 LAB — POCT PREGNANCY, URINE: Preg Test, Ur: NEGATIVE

## 2019-12-18 SURGERY — HYSTERECTOMY, TOTAL, ROBOT-ASSISTED, LAPAROSCOPIC, WITH BILATERAL SALPINGO-OOPHORECTOMY
Anesthesia: General | Site: Abdomen | Laterality: Right

## 2019-12-18 MED ORDER — LIDOCAINE HCL (CARDIAC) PF 100 MG/5ML IV SOSY
PREFILLED_SYRINGE | INTRAVENOUS | Status: DC | PRN
  Administered 2019-12-18: 100 mg via INTRAVENOUS

## 2019-12-18 MED ORDER — DEXAMETHASONE SODIUM PHOSPHATE 10 MG/ML IJ SOLN
INTRAMUSCULAR | Status: AC
  Filled 2019-12-18: qty 1

## 2019-12-18 MED ORDER — ORAL CARE MOUTH RINSE: 15.0000 mL | Freq: Once | OROMUCOSAL | Status: DC

## 2019-12-18 MED ORDER — DEXMEDETOMIDINE HCL 200 MCG/2ML IV SOLN
INTRAVENOUS | Status: DC | PRN
  Administered 2019-12-18 (×4): 4 ug via INTRAVENOUS

## 2019-12-18 MED ORDER — KETOROLAC TROMETHAMINE 30 MG/ML IJ SOLN
INTRAMUSCULAR | Status: DC | PRN
  Administered 2019-12-18: 30 mg via INTRAVENOUS

## 2019-12-18 MED ORDER — KETOROLAC TROMETHAMINE 30 MG/ML IJ SOLN: 30.0000 mg | Freq: Once | INTRAMUSCULAR | Status: DC | PRN

## 2019-12-18 MED ORDER — ROCURONIUM BROMIDE 10 MG/ML (PF) SYRINGE
PREFILLED_SYRINGE | INTRAVENOUS | Status: AC
  Filled 2019-12-18: qty 10

## 2019-12-18 MED ORDER — FENTANYL CITRATE (PF) 250 MCG/5ML IJ SOLN
INTRAMUSCULAR | Status: AC
  Filled 2019-12-18: qty 5

## 2019-12-18 MED ORDER — MIDAZOLAM HCL 5 MG/5ML IJ SOLN
INTRAMUSCULAR | Status: DC | PRN
  Administered 2019-12-18: 2 mg via INTRAVENOUS

## 2019-12-18 MED ORDER — SUGAMMADEX SODIUM 200 MG/2ML IV SOLN
INTRAVENOUS | Status: DC | PRN
  Administered 2019-12-18: 200 mg via INTRAVENOUS

## 2019-12-18 MED ORDER — OXYCODONE HCL 5 MG PO TABS: 5.0000 mg | ORAL_TABLET | Freq: Once | ORAL | Status: DC | PRN

## 2019-12-18 MED ORDER — CEFAZOLIN SODIUM-DEXTROSE 2-4 GM/100ML-% IV SOLN
INTRAVENOUS | Status: AC
  Filled 2019-12-18: qty 100

## 2019-12-18 MED ORDER — ONDANSETRON HCL 4 MG/2ML IJ SOLN: 4.0000 mg | Freq: Once | INTRAMUSCULAR | Status: DC | PRN

## 2019-12-18 MED ORDER — EPHEDRINE 5 MG/ML INJ
INTRAVENOUS | Status: AC
  Filled 2019-12-18: qty 10

## 2019-12-18 MED ORDER — FENTANYL CITRATE (PF) 100 MCG/2ML IJ SOLN
INTRAMUSCULAR | Status: DC | PRN
  Administered 2019-12-18: 50 ug via INTRAVENOUS
  Administered 2019-12-18: 100 ug via INTRAVENOUS
  Administered 2019-12-18 (×2): 50 ug via INTRAVENOUS
  Administered 2019-12-18: 100 ug via INTRAVENOUS

## 2019-12-18 MED ORDER — MIDAZOLAM HCL 2 MG/2ML IJ SOLN
INTRAMUSCULAR | Status: AC
  Filled 2019-12-18: qty 2

## 2019-12-18 MED ORDER — BUPIVACAINE HCL (PF) 0.25 % IJ SOLN
INTRAMUSCULAR | Status: DC | PRN
  Administered 2019-12-18: 15 mL

## 2019-12-18 MED ORDER — EPHEDRINE SULFATE 50 MG/ML IJ SOLN
INTRAMUSCULAR | Status: DC | PRN
  Administered 2019-12-18: 20 mg via INTRAVENOUS

## 2019-12-18 MED ORDER — LIDOCAINE 2% (20 MG/ML) 5 ML SYRINGE
INTRAMUSCULAR | Status: AC
  Filled 2019-12-18: qty 5

## 2019-12-18 MED ORDER — OXYCODONE-ACETAMINOPHEN 5-325 MG PO TABS
ORAL_TABLET | ORAL | Status: AC
  Filled 2019-12-18: qty 1

## 2019-12-18 MED ORDER — HYDROMORPHONE HCL 1 MG/ML IJ SOLN
INTRAMUSCULAR | Status: AC
  Filled 2019-12-18: qty 1

## 2019-12-18 MED ORDER — OXYCODONE HCL 5 MG/5ML PO SOLN: 5.0000 mg | Freq: Once | ORAL | Status: DC | PRN

## 2019-12-18 MED ORDER — HYDROCODONE-ACETAMINOPHEN 5-325 MG PO TABS: 1.0000 | ORAL_TABLET | ORAL | Status: DC | PRN

## 2019-12-18 MED ORDER — FENTANYL CITRATE (PF) 100 MCG/2ML IJ SOLN
INTRAMUSCULAR | Status: AC
  Filled 2019-12-18: qty 2

## 2019-12-18 MED ORDER — PROPOFOL 10 MG/ML IV BOLUS
INTRAVENOUS | Status: AC
  Filled 2019-12-18: qty 20

## 2019-12-18 MED ORDER — HYDROMORPHONE HCL 1 MG/ML IJ SOLN
0.2500 mg | INTRAMUSCULAR | Status: DC | PRN
  Administered 2019-12-18 (×2): 0.5 mg via INTRAVENOUS

## 2019-12-18 MED ORDER — PROPOFOL 10 MG/ML IV BOLUS
INTRAVENOUS | Status: DC | PRN
  Administered 2019-12-18: 100 mg via INTRAVENOUS
  Administered 2019-12-18: 50 mg via INTRAVENOUS
  Administered 2019-12-18: 150 mg via INTRAVENOUS

## 2019-12-18 MED ORDER — OXYCODONE-ACETAMINOPHEN 5-325 MG PO TABS
2.0000 | ORAL_TABLET | ORAL | Status: DC | PRN
  Administered 2019-12-18 (×2): 1 via ORAL
  Administered 2019-12-19 (×2): 2 via ORAL

## 2019-12-18 MED ORDER — ACETAMINOPHEN 325 MG PO TABS: 650.0000 mg | ORAL_TABLET | ORAL | Status: DC | PRN

## 2019-12-18 MED ORDER — ONDANSETRON HCL 4 MG/2ML IJ SOLN
4.0000 mg | Freq: Four times a day (QID) | INTRAMUSCULAR | Status: DC | PRN
  Administered 2019-12-18: 4 mg via INTRAVENOUS

## 2019-12-18 MED ORDER — ONDANSETRON HCL 4 MG/2ML IJ SOLN
INTRAMUSCULAR | Status: AC
  Filled 2019-12-18: qty 2

## 2019-12-18 MED ORDER — ROCURONIUM BROMIDE 100 MG/10ML IV SOLN
INTRAVENOUS | Status: DC | PRN
  Administered 2019-12-18: 60 mg via INTRAVENOUS
  Administered 2019-12-18: 20 mg via INTRAVENOUS

## 2019-12-18 MED ORDER — ONDANSETRON HCL 4 MG/2ML IJ SOLN
INTRAMUSCULAR | Status: DC | PRN
  Administered 2019-12-18: 4 mg via INTRAVENOUS

## 2019-12-18 MED ORDER — OXYCODONE-ACETAMINOPHEN 7.5-325 MG PO TABS: 1.0000 | ORAL_TABLET | Freq: Four times a day (QID) | ORAL | 0 refills | Status: DC | PRN

## 2019-12-18 MED ORDER — CHLORHEXIDINE GLUCONATE 0.12 % MT SOLN: 15.0000 mL | Freq: Once | OROMUCOSAL | Status: DC

## 2019-12-18 MED ORDER — LACTATED RINGERS IV SOLN: INTRAVENOUS | Status: DC

## 2019-12-18 MED ORDER — DEXAMETHASONE SODIUM PHOSPHATE 4 MG/ML IJ SOLN
INTRAMUSCULAR | Status: DC | PRN
  Administered 2019-12-18: 10 mg via INTRAVENOUS

## 2019-12-18 SURGICAL SUPPLY — 59 items
ADH SKN CLS APL DERMABOND .7 (GAUZE/BANDAGES/DRESSINGS) ×2
CATH FOLEY 3WAY  5CC 16FR (CATHETERS) ×4
CATH FOLEY 3WAY 5CC 16FR (CATHETERS) ×2 IMPLANT
COVER BACK TABLE 60X90IN (DRAPES) ×4 IMPLANT
COVER TIP SHEARS 8 DVNC (MISCELLANEOUS) ×2 IMPLANT
COVER TIP SHEARS 8MM DA VINCI (MISCELLANEOUS) ×4
COVER WAND RF STERILE (DRAPES) ×4 IMPLANT
DECANTER SPIKE VIAL GLASS SM (MISCELLANEOUS) ×8 IMPLANT
DEFOGGER SCOPE WARMER CLEARIFY (MISCELLANEOUS) ×4 IMPLANT
DERMABOND ADVANCED (GAUZE/BANDAGES/DRESSINGS) ×2
DERMABOND ADVANCED .7 DNX12 (GAUZE/BANDAGES/DRESSINGS) ×2 IMPLANT
DRAPE ARM DVNC X/XI (DISPOSABLE) ×8 IMPLANT
DRAPE COLUMN DVNC XI (DISPOSABLE) ×2 IMPLANT
DRAPE DA VINCI XI ARM (DISPOSABLE) ×16
DRAPE DA VINCI XI COLUMN (DISPOSABLE) ×4
DRAPE UTILITY XL STRL (DRAPES) ×2 IMPLANT
DURAPREP 26ML APPLICATOR (WOUND CARE) ×4 IMPLANT
ELECT REM PT RETURN 9FT ADLT (ELECTROSURGICAL) ×4
ELECTRODE REM PT RTRN 9FT ADLT (ELECTROSURGICAL) ×2 IMPLANT
GAUZE PETROLATUM 1 X8 (GAUZE/BANDAGES/DRESSINGS) ×4 IMPLANT
GLOVE BIO SURGEON STRL SZ 6.5 (GLOVE) ×9 IMPLANT
GLOVE BIO SURGEON STRL SZ7 (GLOVE) ×2 IMPLANT
GLOVE BIO SURGEON STRL SZ8 (GLOVE) ×2 IMPLANT
GLOVE BIO SURGEONS STRL SZ 6.5 (GLOVE) ×3
GLOVE BIOGEL PI IND STRL 7.0 (GLOVE) ×10 IMPLANT
GLOVE BIOGEL PI IND STRL 7.5 (GLOVE) IMPLANT
GLOVE BIOGEL PI IND STRL 8 (GLOVE) IMPLANT
GLOVE BIOGEL PI INDICATOR 7.0 (GLOVE) ×10
GLOVE BIOGEL PI INDICATOR 7.5 (GLOVE) ×4
GLOVE BIOGEL PI INDICATOR 8 (GLOVE) ×2
GLOVE ECLIPSE 7.5 STRL STRAW (GLOVE) ×2 IMPLANT
GOWN STRL REUS W/TWL XL LVL3 (GOWN DISPOSABLE) ×2 IMPLANT
HOLDER FOLEY CATH W/STRAP (MISCELLANEOUS) ×2 IMPLANT
IRRIG SUCT STRYKERFLOW 2 WTIP (MISCELLANEOUS) ×4
IRRIGATION SUCT STRKRFLW 2 WTP (MISCELLANEOUS) ×2 IMPLANT
LEGGING LITHOTOMY PAIR STRL (DRAPES) ×4 IMPLANT
MANIFOLD NEPTUNE II (INSTRUMENTS) ×2 IMPLANT
OBTURATOR OPTICAL STANDARD 8MM (TROCAR) ×4
OBTURATOR OPTICAL STND 8 DVNC (TROCAR) ×2
OBTURATOR OPTICALSTD 8 DVNC (TROCAR) ×2 IMPLANT
OCCLUDER COLPOPNEUMO (BALLOONS) ×4 IMPLANT
PACK ROBOT WH (CUSTOM PROCEDURE TRAY) ×4 IMPLANT
PACK ROBOTIC GOWN (GOWN DISPOSABLE) ×4 IMPLANT
PACK TRENDGUARD 450 HYBRID PRO (MISCELLANEOUS) ×2 IMPLANT
PAD PREP 24X48 CUFFED NSTRL (MISCELLANEOUS) ×4 IMPLANT
PROTECTOR NERVE ULNAR (MISCELLANEOUS) ×8 IMPLANT
SEAL CANN UNIV 5-8 DVNC XI (MISCELLANEOUS) ×8 IMPLANT
SEAL XI 5MM-8MM UNIVERSAL (MISCELLANEOUS) ×16
SET TRI-LUMEN FLTR TB AIRSEAL (TUBING) ×4 IMPLANT
SUT VIC AB 4-0 PS2 27 (SUTURE) ×12 IMPLANT
SUT VICRYL 0 UR6 27IN ABS (SUTURE) ×4 IMPLANT
SUT VLOC 180 0 9IN  GS21 (SUTURE) ×4
SUT VLOC 180 0 9IN GS21 (SUTURE) ×2 IMPLANT
TIP UTERINE 6.7X8CM BLUE DISP (MISCELLANEOUS) ×2 IMPLANT
TOWEL OR 17X26 10 PK STRL BLUE (TOWEL DISPOSABLE) ×4 IMPLANT
TRAP SPECIMEN MUCUS 40CC (MISCELLANEOUS) ×2 IMPLANT
TRENDGUARD 450 HYBRID PRO PACK (MISCELLANEOUS) ×4
TROCAR PORT AIRSEAL 5X120 (TROCAR) ×4 IMPLANT
WATER STERILE IRR 1000ML POUR (IV SOLUTION) ×4 IMPLANT

## 2019-12-18 NOTE — Op Note (Signed)
Operative Note  12/18/2019  3:28 PM  PATIENT:  Joy [name]  49 y.o. female  PRE-OPERATIVE DIAGNOSIS:  Right ovarian cyst consistent with dermoid/uterine fibroids, pelvic pain  POST-OPERATIVE DIAGNOSIS:  Right ovarian cyst consistent with dermoid/uterine fibroids, pelvic pain  PROCEDURE:  Procedure(s): XI ROBOTIC ASSISTED TOTAL LAPAROSCOPIC HYSTERECTOMY WITH BILATERAL SALPINGECTOMY, PERITONEAL WASHINGS XI ROBOTIC ASSISTED RIGHT OOPHORECTOMY  SURGEON:  Surgeon(s): Princess Bruins, MD Joseph Pierini, MD  ANESTHESIA:   general  FINDINGS: Uterus with fibroids, normal tubes bilaterally.  Right ovarian cyst.  Left ovary normal.  DESCRIPTION OF OPERATION: Under general anesthesia with endotracheal intubation, the patient is in lithotomy position.  She is prepped with DuraPrep on the abdomen and with Betadine on the suprapubic, vulvar and vaginal areas.  She is draped as usual.  The vaginal exam reveals an enlarged uterus with fibroids, a right ovarian mass measuring about 6 cm and a normal left adnexa.  The Foley is inserted in the bladder.  The weighted speculum is inserted in the vagina and the anterior lip of the cervix is grasped with a tenaculum.  The hysterometry is at 10 cm.  A #10 Rumi with the medium Koh ring are put in place.  The tenaculum and weighted speculum were removed.  We go to the abdomen.  The supraumbilical area is infiltrated with Marcaine one quarter plain.  A 1.5 cm incision is done with the scalpel at that level.  The aponeurosis is grasped with cokers and opened with Mayo scissors.  The parietal peritoneum is opened bluntly with the finger.  A pursestring stitch of Vicryl 0 is done on the aponeurosis.  The Hossein is inserted at that level and up pneumoperitoneum is created with CO2.  The camera is inserted at that level. Inspection of the abdomen is negative.  Inspection of the pelvis reveals an enlarged uterus with fibroids, a right ovary with a large  cyst measuring about 6 cm and a normal left ovary.  Both tubes are normal.  No other pathology in the pelvis.  Pictures are taken.  The skin is marked for the ports.  Each site is infiltrated with Marcaine one quarter plain and small incisions are made with the scalpel.  Insertion of robotic ports on the right side in line with the umbilicus under direct vision.  A robotic port is also inserted under direct vision on the left distal site in line with the umbilicus.  The assistant port an 8 mm port is inserted under direct vision at the medial left side in line with the umbilicus.  The patient is positioned in 30 degree Trendelenburg.  The robot is docked.  The robotic instruments are inserted under direct vision with the fenestrated clamp in the first arm, the scissors in the third arm, the camera in the second arm, and the long bipolar in the first arm.  We go to the console.      Inspection of the pelvic area reveals both ureters in normal anatomic position with good peristalsis.  Peritoneal washings were done.  We start on the left side with cauterization and section of the left mesosalpinx.  We then cauterized and section the left utero-ovarian ligament.  We cauterized and section the left round ligament.  We opened the visceral peritoneum anteriorly and started distending the bladder.  The bladder is very adherent to the lower uterine segment due to 2 previous C-sections.  We then cauterized and section the right infundibulopelvic ligament.  We cauterized and sectioned just below the right  ovary.  We cauterized and sectioned the right round ligament.  We then opened the visceral peritoneum anteriorly.  Meticulous dissection very close to the uterus to descend the bladder.  We successfully descend the bladder without complication past the [facility] ring.  We then cauterized and section the right and left uterine arteries.  The vaginal occluder was inflated.  The vagina was opened as high as possible on top of the Koh  ring with the tip of the scissors anteriorly then posteriorly and finally on each side to completely detach the uterus with cervix with bilateral tubes and right ovary.  The specimen was successfully passed vaginally without rupture of the right ovarian cyst.  The specimen was sent to pathology.  The vaginal occluder was put back in place in the vagina.  The robotic instruments were changed to the cutting needle driver in the third arm and the long tip in the first arm.  A Vicryl 0 at 9 inches was inserted in the pelvis.  The vaginal cuff was closed with a running suture from the right angle to the left angle and then back to the middle.  The needle was removed from the pelvis.  Hemostasis was adequate at all levels.  Irrigation and suction was done.  Both ureters were seen with good peristalsis.  Urine was clear.  Pictures were taken.  All robotic instruments were removed under direct vision.  The camera was removed.  The robot was undocked.  The patient was removed from deep Trendelenburg.  The pelvis was further suctioned by laparoscopy.  All ports were removed under direct vision.  The CO2 was evacuated.  The pursestring stitch was attached at the aponeurosis at the supraumbilical incision.  All incisions were closed with Vicryl 4-0 in a subcuticular suture.  Dermabond was added on all incisions.  The vaginal occluder was removed from the vagina.  The patient was brought to recovery room in good and stable status.        ESTIMATED BLOOD LOSS: 250 mL   Intake/Output Summary (Last 24 hours) at 12/18/2019 1528 Last data filed at 12/18/2019 1521 Gross per 24 hour  Intake 1600 ml  Output 250 ml  Net 1350 ml     BLOOD ADMINISTERED:none   LOCAL MEDICATIONS USED:  MARCAINE     SPECIMEN:  Source of Specimen:  Uterus with cervix, bilateral tubes, right ovary.  DISPOSITION OF SPECIMEN:  PATHOLOGY  COUNTS:  YES  PLAN OF CARE: Transfer to PACU  Marie-Lyne LavoieMD3:28 PMTD@

## 2019-12-18 NOTE — H&P (Signed)
Joy [name] is an 49 y.o. female.G3P3L3 Married.   RP: XI Robotic TLH/RSO   HPI: No change x last visit: On the generic of LoEstrin Fe 1/20 with normal withdrawal bleeding monthly. No BTB. Worsening pelvic discomfort and pain. Pelvic US 08/23/2019 showed a persistent complex Rt Ovarian Cyst at 5.6 cm c/w a Dermoid and Uterine IM Fibroids, the largest at 2.5 cm, the endometrial line was normal. The Pelvic US findings were stable c/w the Pelvic US done 06/2019. Ca 125 normal at 9 on 07/26/2019. Urine/BMs normal. Pap test 06/2019 Negative.  Pertinent Gynecological History: Contraception: OCP (estrogen/progesterone) Blood transfusions: none Sexually transmitted diseases: no past history Last mammogram: normal  Last pap: normal     Menstrual History:  Patient's last menstrual period was 12/05/2019.    Past Medical History:  Diagnosis Date  . History of kidney stones   . Hypertension     Past Surgical History:  Procedure Laterality Date  . CESAREAN SECTION     x2    History reviewed. No pertinent family history.  Social History:  reports that she has never smoked. She has never used smokeless tobacco. She reports that she does not drink alcohol and does not use drugs.  Allergies: No Known Allergies  Medications Prior to Admission  Medication Sig Dispense Refill Last Dose  . Cholecalciferol (VITAMIN D) 50 MCG (2000 UT) CAPS Take 2,000 Units by mouth daily.   12/15/2019  . norethindrone-ethinyl estradiol (LOESTRIN FE) 1-20 MG-MCG tablet Take 1 tablet by mouth daily. 1 Package 12 12/17/2019 at Unknown time  . fluticasone (FLONASE) 50 MCG/ACT nasal spray Place into both nostrils daily.   More than a month at Unknown time    REVIEW OF SYSTEMS: A ROS was performed and pertinent positives and negatives are included in the history.  GENERAL: No fevers or chills. HEENT: No change in vision, no earache, sore throat or sinus congestion. NECK: No pain or stiffness.  CARDIOVASCULAR: No chest pain or pressure. No palpitations. PULMONARY: No shortness of breath, cough or wheeze. GASTROINTESTINAL: No abdominal pain, nausea, vomiting or diarrhea, melena or bright red blood per rectum. GENITOURINARY: No urinary frequency, urgency, hesitancy or dysuria. MUSCULOSKELETAL: No joint or muscle pain, no back pain, no recent trauma. DERMATOLOGIC: No rash, no itching, no lesions. ENDOCRINE: No polyuria, polydipsia, no heat or cold intolerance. No recent change in weight. HEMATOLOGICAL: No anemia or easy bruising or bleeding. NEUROLOGIC: No headache, seizures, numbness, tingling or weakness. PSYCHIATRIC: No depression, no loss of interest in normal activity or change in sleep pattern.     Blood pressure (!) 144/76, pulse 65, temperature 98.7 F (37.1 C), temperature source Oral, resp. rate 18, height 5\' 1"  (1.549 m), weight 81 kg, last menstrual period 12/05/2019, SpO2 100 %.  Physical Exam:  See office notes   Results for orders placed or performed during the hospital encounter of 12/18/19 (from the past 24 hour(s))  Pregnancy, urine POC     Status: None   Collection Time: 12/18/19 10:27 AM  Result Value Ref Range   Preg Test, Ur NEGATIVE NEGATIVE   Covid Negative  Pelvic US 08/23/2019: Anteverted uterus irregular contour with multiple intramural and subserous fibroids as listed. Fibroids 1.9 x 1.7 cm., 2.5 x 1.7 cm., 1.1 x 1.2 cm., 2.2 x 1.7 cm., 1.13 x 1.3 cm., 0.8 x 0.8 cm, 0.96 x 0.88 cm, 0.9 x 0.9 cm,. Thin endometrial stripe 3.4 cm with no masses or thickening seen. Left ovary normal size 13 x 12 mm simple  follicle. Right adnexal mass 5.6 x 5.1 x 5.1 cm mixed echogenic mass with solid components which shadow appearance consistent with dermoid. No free fluid. Tender vaginal exam bilaterally.   Assessment/Plan:  49 y.o. G3P0003   1. Fibroids, intramural Many Fibroids, largest 2.5 cm.  2. Cyst of right ovary Persistent right complex ovarian cyst  compatible with a dermoid with CA-125 normal at 9. Uterine fibroids with pelvic pain. Management options reviewed thoroughly with patient. Decision to proceed with an XIrobotic total laparoscopy hysterectomy,bilateral salpingectomy with right oophorectomy and peritoneal washings. Surgery and risks thoroughly reviewed, information pamphlet given.  3. Female pelvic pain As above                         Patient was counseled as to the risk of surgery to include the following:  1. Infection (prohylactic antibiotics will be administered)  2. DVT/Pulmonary Embolism (prophylactic pneumo compression stockings will be used)  3.Trauma to internal organs requiring additional surgical procedure to repair any injury to internal organs requiring perhaps additional hospitalization days.  4.Hemmorhage requiring transfusion and blood products which carry risks such as anaphylactic reaction, hepatitis and AIDS  Patient had received literature information on the procedure scheduled and all her questions were answered and fully accepts all risk.   Marie-Lyne Montrae Braithwaite 12/18/2019, 11:38 AM

## 2019-12-18 NOTE — Progress Notes (Signed)
Pt has been complaining of significant gas pain and bloating. Up to ambulate in hallway and then to bathroom to void. Pt does not want to take narcotic pain meds and insists it is fullness and gas not surgical pain. Wanting to walk again, but diaphoretic and nauseated, small amount of emesis noted. Placed back to bed and Dr. Dellis Filbert made aware, will keep pt in [place] overnight for observation. Orders received and Zofran given for nausea. Husband will return in the morning and verbalizes understanding of plan of care.

## 2019-12-18 NOTE — Anesthesia Procedure Notes (Signed)
Procedure Name: Intubation Date/Time: 12/18/2019 12:21 PM Performed by: Justice Rocher, CRNA Pre-anesthesia Checklist: Patient identified, Emergency Drugs available, Suction available, Patient being monitored and Timeout performed Patient Re-evaluated:Patient Re-evaluated prior to induction Oxygen Delivery Method: Circle system utilized Preoxygenation: Pre-oxygenation with 100% oxygen Induction Type: IV induction Ventilation: Mask ventilation without difficulty Laryngoscope Size: Mac and 3 Grade View: Grade II Tube type: Oral Tube size: 7.0 mm Number of attempts: 1 Airway Equipment and Method: Stylet and Oral airway Placement Confirmation: ETT inserted through vocal cords under direct vision,  positive ETCO2,  breath sounds checked- equal and bilateral and CO2 detector Secured at: 22 cm Tube secured with: Tape Dental Injury: Teeth and Oropharynx as per pre-operative assessment

## 2019-12-18 NOTE — Anesthesia Postprocedure Evaluation (Signed)
Anesthesia Post Note  Patient: Joy [name]  Procedure(s) Performed: XI ROBOTIC ASSISTED TOTAL LAPAROSCOPIC HYSTERECTOMY WITH BILATERAL SALPINGECTOMY, PERITONEAL WASHINGS (Bilateral Abdomen) XI ROBOTIC ASSISTED OOPHORECTOMY (Right Abdomen)     Patient location during evaluation: PACU Anesthesia Type: General Level of consciousness: awake and alert Pain management: pain level controlled Vital Signs Assessment: post-procedure vital signs reviewed and stable Respiratory status: spontaneous breathing, nonlabored ventilation, respiratory function stable and patient connected to nasal cannula oxygen Cardiovascular status: blood pressure returned to baseline and stable Postop Assessment: no apparent nausea or vomiting Anesthetic complications: no   No complications documented.  Last Vitals:  Vitals:   12/18/19 1545 12/18/19 1600  BP: (!) 152/82 (!) 145/86  Pulse: 74 70  Resp: (!) 21 16  Temp:    SpO2: 100% 100%    Last Pain:  Vitals:   12/18/19 1600  TempSrc:   PainSc: [place]

## 2019-12-18 NOTE — Transfer of Care (Signed)
Immediate Anesthesia Transfer of Care Note  Patient: Joy [name]  Procedure(s) Performed: Procedure(s) (LRB): XI ROBOTIC ASSISTED TOTAL LAPAROSCOPIC HYSTERECTOMY WITH BILATERAL SALPINGECTOMY, PERITONEAL WASHINGS (Bilateral) XI ROBOTIC ASSISTED OOPHORECTOMY (Right)  Patient Location: PACU  Anesthesia Type: General  Level of Consciousness: awake, sedated, patient cooperative and responds to stimulation  Airway & Oxygen Therapy: Patient Spontanous Breathing and Patient connected to [place] 02 and soft FM  Post-op Assessment: Report given to PACU RN, Post -op Vital signs reviewed and stable and Patient moving all extremities  Post vital signs: Reviewed and stable  Complications: No apparent anesthesia complications

## 2019-12-19 ENCOUNTER — Encounter (HOSPITAL_BASED_OUTPATIENT_CLINIC_OR_DEPARTMENT_OTHER): Payer: Self-pay | Admitting: Obstetrics & Gynecology

## 2019-12-19 MED ORDER — OXYCODONE-ACETAMINOPHEN 5-325 MG PO TABS
ORAL_TABLET | ORAL | Status: AC
  Filled 2019-12-19: qty 2

## 2019-12-19 NOTE — Progress Notes (Signed)
Dr. Dellis Filbert cell called no answer, office called will have nurse talk to her about orders and prescriptions.

## 2019-12-19 NOTE — Progress Notes (Signed)
POD#1 XI Robotic TLH/Bilateral Salpingectomy/Right Oophorectomy  Subjective: Patient reports tolerating PO, + flatus and no problems voiding.    Objective: I have reviewed patient's vital signs.  vital signs, intake and output, medications and labs.  Vitals:   12/19/19 0215 12/19/19 0557  BP: 130/68 129/68  Pulse: 66 63  Resp: 18 18  Temp: 99.1 F (37.3 C) 99.4 F (37.4 C)  SpO2: 98% 97%   I/O last 3 completed shifts: In: 2060 [P.O.:360; I.V.:1700] Out: 2297 [Urine:2100; Emesis/NG output:25; Blood:250] No intake/output data recorded.  Results for orders placed or performed during the hospital encounter of 12/18/19 (from the past 24 hour(s))  Pregnancy, urine POC     Status: None   Collection Time: 12/18/19 10:27 AM  Result Value Ref Range   Preg Test, Ur NEGATIVE NEGATIVE  CBC     Status: Abnormal   Collection Time: 12/18/19  5:29 PM  Result Value Ref Range   WBC 12.6 (H) 4.0 - 10.5 K/uL   RBC 4.32 3.87 - 5.11 MIL/uL   Hemoglobin 13.5 12.0 - 15.0 g/dL   HCT 40.5 36 - 46 %   MCV 93.8 80.0 - 100.0 fL   MCH 31.3 26.0 - 34.0 pg   MCHC 33.3 30.0 - 36.0 g/dL   RDW 12.8 11.5 - 15.5 %   Platelets 235 150 - 400 K/uL   nRBC 0.0 0.0 - 0.2 %    EXAM General: alert and cooperative Resp: clear to auscultation bilaterally Cardio: regular rate and rhythm GI: normal findings: Soft, not distended.  BS present and incision: clean, dry and intact Extremities: no edema, redness or tenderness in the calves or thighs Vaginal Bleeding: none  Assessment: s/p Procedure(s): XI ROBOTIC ASSISTED TOTAL LAPAROSCOPIC HYSTERECTOMY WITH BILATERAL SALPINGECTOMY, PERITONEAL WASHINGS XI ROBOTIC ASSISTED OOPHORECTOMY: stable, progressing well and tolerating diet  Plan: Discharge home  LOS: 0 days    Princess Bruins, MD 12/19/2019 9:13 AM

## 2019-12-19 NOTE — Discharge Instructions (Signed)
Histerectoma total laparoscpica, cuidados posteriores Total Laparoscopic Hysterectomy, Care After Lea esta informacin sobre cmo cuidarse despus del procedimiento. Su mdico tambin podr darle indicaciones ms especficas. Comunquese con su mdico si tiene problemas o preguntas. Qu puedo esperar despus del procedimiento? Despus del procedimiento, es comn tener los siguientes sntomas:  Dolor y hematomas alrededor de la incisin.  Dolor de garganta si le colocaron un tubo para respirar durante la ciruga.  Fatiga.  Prdida del apetito.  Menos inters sexual. Si se extirparon los ovarios tambin, es comn tener sntomas de menopausia como acaloramiento, sudor nocturno y falta de sueo (insomnio). Siga estas indicaciones en su casa: Baarse  No tome baos de inmersin, no nade ni use el jacuzzi hasta que el mdico lo autorice. Es posible que deba tomar duchas solamente durante 2 a 3 semanas.  Mantenga la venda (vendaje) seca hasta que el mdico le diga que se la puede quitar. Cuidados de la incisin   Siga las indicaciones del mdico acerca del cuidado de las incisiones. Haga lo siguiente: ? Lvese las manos con agua y jabn antes de cambiar el apsito. Use desinfectante para manos si no dispone de agua y jabn. ? Cambie los vendajes como se lo haya indicado el mdico. ? No retire los puntos (suturas), la goma para cerrar la piel o las tiras adhesivas. Es posible que estos cierres cutneos deban permanecer en la piel durante 2semanas o ms. Si los bordes de las tiras adhesivas empiezan a despegarse y enroscarse, puede recortar los que estn sueltos. No retire las tiras adhesivas por completo a menos que el mdico se lo indique.  Controle todos los das la zona de la incisin para detectar signos de infeccin. Est atento a los siguientes signos: ? Dolor, hinchazn o enrojecimiento. ? Lquido o sangre. ? Calor. ? Pus o mal olor. Actividad  Descanse y duerma lo  suficiente.  No levante nada que pese ms de 10libras (4,5kg) hasta un mes despus de la ciruga o durante el tiempo que le haya indicado el mdico.  No conduzca ni use maquinaria pesada mientras toma analgsicos recetados.  No conduzca durante 24horas si le dieron un medicamento para ayudarla a que se relaje (sedante).  Retome sus actividades normales como se lo haya indicado el mdico. Pregntele al mdico qu actividades son seguras para usted. Estilo de vida   No consuma ningn producto que contenga nicotina o tabaco, como cigarrillos y cigarrillos electrnicos. Estos pueden retrasar la recuperacin. Si necesita ayuda para dejar de fumar, consulte al mdico.  No beba alcohol hasta que el mdico lo autorice. Instrucciones generales  No se haga duchas vaginales, no utilice tampones ni tenga relaciones sexuales durante al menos 6 semanas o como se lo haya indicado el mdico.  Tome los medicamentos de venta libre y los recetados solamente como se lo haya indicado el mdico.  Para controlarse la fiebre, tmese la temperatura al menos una vez por da durante la recuperacin.  Si experimenta cambios fsicos o emocionales despus del procedimiento, hable con su mdico o un terapeuta.  A fin de prevenir o tratar el estreimiento mientras toma analgsicos recetados, el mdico puede recomendarle lo siguiente: ? Beber suficiente lquido como para mantener la orina clara o de color amarillo plido. ? Tomar medicamentos recetados o de venta libre. ? Consumir alimentos ricos en fibra, como frutas y verduras frescas, cereales integrales y frijoles. ? Limitar el consumo de alimentos ricos en grasa y azcares procesados, como alimentos fritos o dulces.  Concurra a todas   las visitas de seguimiento como se lo haya indicado el mdico. Esto es importante. Comunquese con un mdico si:  Tiene escalofros o fiebre.  Nota enrojecimiento o hinchazn, o siente dolor alrededor de una incisin.  Le  sale lquido o sangre de una incisin.  La incisin est caliente al tacto.  Tiene pus o percibe que sale mal olor del lugar de la incisin.  Una incisin se abre.  Se siente mareada o que va a desvanecerse.  Siente dolor o tiene sangre al orinar.  Tiene nuseas, vmitos o diarrea que no desaparecen.  Tiene una secrecin vaginal anormal.  Tiene una erupcin cutnea.  El dolor no mejora con medicamentos. Solicite ayuda de inmediato si:  Tiene fiebre, y los sntomas empeoran repentinamente.  Siente un dolor abdominal intenso.  Siente dolor en el pecho.  Le falta el aire.  Se desmaya.  Siente dolor u observa hinchazn o enrojecimiento en la pierna.  Tiene una hemorragia vaginal abundante, con cogulos sanguneos. Resumen  Despus del procedimiento, es comn tener dolor abdominal. El mdico le prescribir medicamentos para ese dolor.  No tome baos de inmersin, no nade ni use el jacuzzi hasta que el mdico lo autorice.  No levante nada que pese ms de 10libras (4,5kg) hasta un mes despus de la ciruga o durante el tiempo que le haya indicado el mdico.  Comunquese con el mdico si tiene algn signo o sntoma de infeccin despus del procedimiento. Esta informacin no tiene como fin reemplazar el consejo del mdico. Asegrese de hacerle al mdico cualquier pregunta que tenga. Document Revised: 01/11/2017 Document Reviewed: 01/11/2017 Elsevier Patient Education  2020 [employer]  

## 2019-12-19 NOTE — Discharge Summary (Signed)
Physician Discharge Summary  Patient ID: Joy [name] MRN: [id number] DOB/AGE: Dec 24, 1970 49 y.o.  Admit date: 12/18/2019 Discharge date: 12/19/2019  Admission Diagnoses: Post-operative state [Z98.890] Postoperative state [Z98.890]   Discharge Diagnoses:  Active Problems:   Post-operative state   Postoperative state   Discharged Condition: good  Consults:None  Significant Diagnostic Studies: None  Treatments:surgery: XI Robotic Total Laparoscopic Hysterectomy with bilateral Salpingectomy and Right Oophorectomy.  Vitals:   12/19/19 0215 12/19/19 0557  BP: 130/68 129/68  Pulse: 66 63  Resp: 18 18  Temp: 99.1 F (37.3 C) 99.4 F (37.4 C)  SpO2: 98% 97%     Hospital Course: Good  Discharge Exam: Normal postop  Disposition: Discharge home    Allergies as of 12/19/2019   No Known Allergies     Medication List    STOP taking these medications   norethindrone-ethinyl estradiol 1-20 MG-MCG tablet Commonly known as: LOESTRIN FE     TAKE these medications   fluticasone 50 MCG/ACT nasal spray Commonly known as: FLONASE Place into both nostrils daily.   oxyCODONE-acetaminophen 7.5-325 MG tablet Commonly known as: Percocet Take 1 tablet by mouth every 6 (six) hours as needed for severe pain.   Vitamin D 50 MCG (2000 UT) Caps Take 2,000 Units by mouth daily.         Follow-up Information    Princess Bruins, MD Follow up in 3 week(s).   Specialty: Obstetrics and Gynecology Contact information: [address] [place] [address] [phone]                Signed: Princess Bruins 12/19/2019, 9:10 AM

## 2019-12-20 LAB — CYTOLOGY - NON PAP

## 2019-12-20 LAB — SURGICAL PATHOLOGY

## 2020-01-08 ENCOUNTER — Other Ambulatory Visit: Payer: Self-pay

## 2020-01-08 ENCOUNTER — Ambulatory Visit (INDEPENDENT_AMBULATORY_CARE_PROVIDER_SITE_OTHER): Payer: Self-pay | Admitting: Obstetrics & Gynecology

## 2020-01-08 ENCOUNTER — Encounter: Payer: Self-pay | Admitting: Obstetrics & Gynecology

## 2020-01-08 VITALS — BP 124/82

## 2020-01-08 DIAGNOSIS — Z09 Encounter for follow-up examination after completed treatment for conditions other than malignant neoplasm: Secondary | ICD-10-CM

## 2020-01-08 NOTE — Progress Notes (Signed)
    Joy [name] April 12, 1971 [id number]        49 y.o.  G3P3L3   RP: Postop XI Robotic TLH/Bilateral Salpingectomy on 12/18/2019  HPI: Doing very well postop.  No abdominal pelvic pain.  No vaginal bleeding or discharge.  No fever.  Urine and bowel movements normal.   OB History  Gravida Para Term Preterm AB Living  3       0 3  SAB TAB Ectopic Multiple Live Births      0        # Outcome Date GA Lbr Len/2nd Weight Sex Delivery Anes PTL Lv  3 Gravida           2 Gravida           1 Gravida             Past medical history,surgical history, problem list, medications, allergies, family history and social history were all reviewed and documented in the EPIC chart.   Directed ROS with pertinent positives and negatives documented in the history of present illness/assessment and plan.  Exam:  Vitals:   01/08/20 0848  BP: 124/82   General appearance:  Normal  Abdomen: Incisions healing well  Gynecologic exam: Vulva normal.  Speculum: Vaginal vault well-healed.  No vaginal discharge or bleeding.  Bimanual exam: Vaginal vault intact with no induration and no tenderness.  No pelvic mass felt.   Assessment/Plan:  49 y.o. G3P0003   1. Status post gynecological surgery, follow-up exam Excellent postop healing.  Patient can resume physical activity and sexual intercourse.  Pathology benign.  Will follow up in 1 year for annual gynecologic exam.  Princess Bruins MD, 9:36 AM 01/08/2020

## 2020-02-05 ENCOUNTER — Other Ambulatory Visit: Payer: Self-pay

## 2020-02-05 ENCOUNTER — Ambulatory Visit (INDEPENDENT_AMBULATORY_CARE_PROVIDER_SITE_OTHER): Payer: Self-pay | Admitting: Obstetrics & Gynecology

## 2020-02-05 ENCOUNTER — Encounter: Payer: Self-pay | Admitting: Obstetrics & Gynecology

## 2020-02-05 ENCOUNTER — Telehealth: Payer: Self-pay

## 2020-02-05 VITALS — BP 128/80

## 2020-02-05 DIAGNOSIS — Z09 Encounter for follow-up examination after completed treatment for conditions other than malignant neoplasm: Secondary | ICD-10-CM

## 2020-02-05 NOTE — Progress Notes (Signed)
    Joy [name] 03-09-1971 [id number]        49 y.o.  G3P0003   RP: Post XI TLH/Bilateral Salpingectomy/Rt Oophorectomy 12/18/2019  HPI: Doing very well postop.  No abdo-pelvic pain.  No vaginal bleeding.  Normal vaginal secretions.  Urine/BMs normal.  No fever.   OB History  Gravida Para Term Preterm AB Living  3       0 3  SAB TAB Ectopic Multiple Live Births      0        # Outcome Date GA Lbr Len/2nd Weight Sex Delivery Anes PTL Lv  3 Gravida           2 Gravida           1 Gravida             Past medical history,surgical history, problem list, medications, allergies, family history and social history were all reviewed and documented in the EPIC chart.   Directed ROS with pertinent positives and negatives documented in the history of present illness/assessment and plan.  Exam:  Vitals:   02/05/20 1459  BP: 128/80   General appearance:  Normal  Abdomen: Incisions well healed.  Gynecologic exam: Vulva normal.  Bimanual exam: Vaginal vault well closed with no induration. No pelvic mass felt. Nontender. No blood and normal vaginal secretions.   Assessment/Plan:  49 y.o. G3P0003   1. Status post gynecological surgery, follow-up exam Very good postop healing. No complication. Can resume sexual activities. Will go back to work on the first week of September.  Princess Bruins MD, 3:03 PM 02/05/2020

## 2020-02-05 NOTE — Telephone Encounter (Signed)
Dr. Dellis Filbert, [facility] sent me a message, "Per Dr Dellis Filbert patient can returned to work first week in September. Patient also wants letter to say how much she can lift and how long to stand. Patient wants letter mailed to her."  Please advise about lifting/standing restrictions and for how long.

## 2020-02-06 NOTE — Telephone Encounter (Signed)
Really, she is already starting work 9 weeks postop.  No restriction at that point anymore.

## 2020-02-07 NOTE — Telephone Encounter (Signed)
Letter mailed to patient. Copy in chart.

## 2021-01-15 ENCOUNTER — Emergency Department (HOSPITAL_COMMUNITY)
Admission: EM | Admit: 2021-01-15 | Discharge: 2021-01-15 | Disposition: A | Discharge status: Home / Self Care | Payer: Self-pay | Attending: Emergency Medicine | Admitting: Emergency Medicine

## 2021-01-15 ENCOUNTER — Encounter (HOSPITAL_COMMUNITY): Payer: Self-pay | Admitting: Emergency Medicine

## 2021-01-15 ENCOUNTER — Emergency Department (HOSPITAL_COMMUNITY): Payer: Self-pay

## 2021-01-15 DIAGNOSIS — I10 Essential (primary) hypertension: Secondary | ICD-10-CM | POA: Insufficient documentation

## 2021-01-15 DIAGNOSIS — R630 Anorexia: Secondary | ICD-10-CM | POA: Insufficient documentation

## 2021-01-15 DIAGNOSIS — R197 Diarrhea, unspecified: Secondary | ICD-10-CM | POA: Insufficient documentation

## 2021-01-15 DIAGNOSIS — R109 Unspecified abdominal pain: Secondary | ICD-10-CM | POA: Insufficient documentation

## 2021-01-15 DIAGNOSIS — R6883 Chills (without fever): Secondary | ICD-10-CM | POA: Insufficient documentation

## 2021-01-15 LAB — CBC WITH DIFFERENTIAL/PLATELET
Abs Immature Granulocytes: 0.03 10*3/uL (ref 0.00–0.07)
Basophils Absolute: 0.1 10*3/uL (ref 0.0–0.1)
Basophils Relative: 1 %
Eosinophils Absolute: 0.1 10*3/uL (ref 0.0–0.5)
Eosinophils Relative: 2 %
HCT: 43.2 % (ref 36.0–46.0)
Hemoglobin: 14.7 g/dL (ref 12.0–15.0)
Immature Granulocytes: 0 %
Lymphocytes Relative: 25 %
Lymphs Abs: 1.8 10*3/uL (ref 0.7–4.0)
MCH: 30.9 pg (ref 26.0–34.0)
MCHC: 34 g/dL (ref 30.0–36.0)
MCV: 90.9 fL (ref 80.0–100.0)
Monocytes Absolute: 0.6 10*3/uL (ref 0.1–1.0)
Monocytes Relative: 9 %
Neutro Abs: 4.7 10*3/uL (ref 1.7–7.7)
Neutrophils Relative %: 63 %
Platelets: 283 10*3/uL (ref 150–400)
RBC: 4.75 MIL/uL (ref 3.87–5.11)
RDW: 12.5 % (ref 11.5–15.5)
WBC: 7.4 10*3/uL (ref 4.0–10.5)
nRBC: 0 % (ref 0.0–0.2)

## 2021-01-15 LAB — URINALYSIS, ROUTINE W REFLEX MICROSCOPIC
Bilirubin Urine: NEGATIVE
Glucose, UA: NEGATIVE mg/dL
Ketones, ur: 20 mg/dL — AB
Leukocytes,Ua: NEGATIVE
Nitrite: NEGATIVE
Protein, ur: NEGATIVE mg/dL
Specific Gravity, Urine: 1.018 (ref 1.005–1.030)
pH: 5 (ref 5.0–8.0)

## 2021-01-15 LAB — COMPREHENSIVE METABOLIC PANEL
ALT: 20 U/L (ref 0–44)
AST: 21 U/L (ref 15–41)
Albumin: 3.4 g/dL — ABNORMAL LOW (ref 3.5–5.0)
Alkaline Phosphatase: 56 U/L (ref 38–126)
Anion gap: 9 (ref 5–15)
BUN: 8 mg/dL (ref 6–20)
CO2: 24 mmol/L (ref 22–32)
Calcium: 8.6 mg/dL — ABNORMAL LOW (ref 8.9–10.3)
Chloride: 105 mmol/L (ref 98–111)
Creatinine, Ser: 0.55 mg/dL (ref 0.44–1.00)
GFR, Estimated: >60 mL/min (ref >60)
Glucose, Bld: 87 mg/dL (ref 70–99)
Potassium: 3.6 mmol/L (ref 3.5–5.1)
Sodium: 138 mmol/L (ref 135–145)
Total Bilirubin: 0.5 mg/dL (ref 0.3–1.2)
Total Protein: 6.7 g/dL (ref 6.5–8.1)

## 2021-01-15 LAB — LIPASE, BLOOD: Lipase: 25 U/L (ref 11–51)

## 2021-01-15 MED ORDER — IOHEXOL 350 MG/ML SOLN
100.0000 mL | Freq: Once | INTRAVENOUS | Status: AC | PRN
  Administered 2021-01-15: 100 mL via INTRAVENOUS

## 2021-01-15 NOTE — ED Provider Notes (Signed)
[facility] EMERGENCY DEPARTMENT Provider Note   CSN: [id number] Arrival date & time: 01/15/21  [id number]     History Chief Complaint  Patient presents with   Abdominal Pain    Joy [name] is a 50 y.o. female.  The history is provided by the patient. The history is limited by a language barrier. A language interpreter was used.  Abdominal Pain Pain quality: aching and cramping   Pain radiates to:  Does not radiate Pain severity:  Mild Onset quality:  Gradual Duration:  5 days Progression:  Unchanged Chronicity:  New Context: not sick contacts and not suspicious food intake   Relieved by:  Nothing Associated symptoms: anorexia, chills and diarrhea   Associated symptoms: no fever, no nausea and no vomiting       Saturday night, she began to experience pain and diarrhea. She described cramping in her belly throughout Sunday and Monday. Endorsed chills with worsening stomach pain, described as an ache. She denies any nausea or vomiting. She was seen in urgent care and was sent to the ED for evaluation for appendicitis. Has been tolerating chicken broth but has had decreased PO intake. She denies any burning with urination, no increased urinary frequency, no suprapubic pain. Small amount of blood in her stool earlier this week.   Past Medical History:  Diagnosis Date   History of kidney stones    Hypertension     Patient Active Problem List   Diagnosis Date Noted   Postoperative state 12/18/2019   Post-operative state 12/17/2019    Past Surgical History:  Procedure Laterality Date   CESAREAN SECTION     x2   ROBOTIC ASSISTED TOTAL HYSTERECTOMY WITH BILATERAL SALPINGO OOPHERECTOMY Bilateral 12/18/2019   Procedure: XI ROBOTIC ASSISTED TOTAL LAPAROSCOPIC HYSTERECTOMY WITH BILATERAL SALPINGECTOMY, PERITONEAL WASHINGS;  Surgeon: Princess Bruins, MD;  Location: [place];  Service: Gynecology;  Laterality: Bilateral;  requests to  follow in [facility] Gynecology reserved time request two hours   XI ROBOTIC ASSISTED OOPHORECTOMY Right 12/18/2019   Procedure: XI ROBOTIC ASSISTED OOPHORECTOMY;  Surgeon: Princess Bruins, MD;  Location: [place];  Service: Gynecology;  Laterality: Right;     OB History     Gravida  3   Para      Term      Preterm      AB  0   Living  3      SAB      IAB      Ectopic  0   Multiple      Live Births              No family history on file.  Social History   Tobacco Use   Smoking status: Never   Smokeless tobacco: Never  Vaping Use   Vaping Use: Never used  Substance Use Topics   Alcohol use: No   Drug use: No    Home Medications Prior to Admission medications   Not on File    Allergies    Pollen extract and Vitamin a  Review of Systems   Review of Systems  Constitutional:  Positive for chills. Negative for fever.  Gastrointestinal:  Positive for abdominal pain, anorexia and diarrhea. Negative for nausea and vomiting.  All other systems reviewed and are negative.  Physical Exam Updated Vital Signs BP 138/75   Pulse (!) 54   Temp 98.2 F (36.8 C) (Oral)   Resp 16   LMP 12/05/2019   SpO2 98%  Physical Exam Vitals and nursing note reviewed.  Constitutional:      General: She is not in acute distress.    Appearance: She is well-developed.  HENT:     Head: Normocephalic and atraumatic.  Eyes:     Conjunctiva/sclera: Conjunctivae normal.  Cardiovascular:     Rate and Rhythm: Normal rate and regular rhythm.     Heart sounds: No murmur heard. Pulmonary:     Effort: Pulmonary effort is normal. No respiratory distress.     Breath sounds: Normal breath sounds.  Abdominal:     Palpations: Abdomen is soft.     Tenderness: There is generalized abdominal tenderness. There is no guarding or rebound.  Musculoskeletal:     Cervical back: Neck supple.  Skin:    General: Skin is warm and dry.  Neurological:     Mental  Status: She is alert.    ED Results / Procedures / Treatments   Labs (all labs ordered are listed, but only abnormal results are displayed) Labs Reviewed  COMPREHENSIVE METABOLIC PANEL - Abnormal; Notable for the following components:      Result Value   Calcium 8.6 (*)    Albumin 3.4 (*)    All other components within normal limits  URINALYSIS, ROUTINE W REFLEX MICROSCOPIC - Abnormal; Notable for the following components:   APPearance HAZY (*)    Hgb urine dipstick MODERATE (*)    Ketones, ur 20 (*)    Bacteria, UA FEW (*)    All other components within normal limits  CBC WITH DIFFERENTIAL/PLATELET  LIPASE, BLOOD    EKG None  Radiology CT Abdomen Pelvis W Contrast  Result Date: 01/15/2021 CLINICAL DATA:  Right lower quadrant abdominal pain EXAM: CT ABDOMEN AND PELVIS WITH CONTRAST TECHNIQUE: Multidetector CT imaging of the abdomen and pelvis was performed using the standard protocol following bolus administration of intravenous contrast. CONTRAST:  151m OMNIPAQUE IOHEXOL 350 MG/ML SOLN COMPARISON:  None. FINDINGS: Lower chest: Included lung bases are clear.  Heart size is normal. Hepatobiliary: No focal liver abnormality is seen. No gallstones, gallbladder wall thickening, or biliary dilatation. Pancreas: Unremarkable. No pancreatic ductal dilatation or surrounding inflammatory changes. Spleen: Normal in size without focal abnormality. Adrenals/Urinary Tract: Unremarkable adrenal glands. Nonobstructing 2 mm stone within the lower pole of the right kidney. Subcentimeter probable right renal cyst. Kidneys enhance symmetrically. No hydronephrosis. Punctate calcifications within the low pelvis bilaterally likely reflect vascular phleboliths. No definite ureteral calculi. Borderline thickening of the urinary bladder wall, which may be accentuated by under distension. Stomach/Bowel: Stomach is within normal limits. No dilated loops of small bowel. Unremarkable appearance of the terminal  ileum. A normal appendix is seen in the right lower quadrant. Mild long segment thickening of the colon, most pronounced within the ascending and proximal transverse colon where there is subtle pericolonic fat stranding (series 3, image 35). Vascular/Lymphatic: No significant vascular findings are present. No enlarged abdominal or pelvic lymph nodes. Reproductive: Status post hysterectomy. No right adnexal abnormality. Ovoid cystic appearing lesion in the left adnexum measures up to 3.7 cm with internal density greater than expected for simple fluid. Other: No free fluid. No abdominopelvic fluid collection. No pneumoperitoneum. No abdominal wall hernia. Musculoskeletal: No acute or significant osseous findings. IMPRESSION: 1. Mild long segment thickening of the colon, most pronounced within the ascending and proximal transverse colon where there is subtle pericolonic fat stranding. Findings suggestive of an infectious or inflammatory colitis. 2. Normal appendix. 3. Nonobstructing 2 mm right renal stone. 4. Borderline  thickening of the urinary bladder wall, which may be accentuated by under distension. Correlate with urinalysis to exclude cystitis. 5. 3.7 cm slightly hyperdense left adnexal cyst, likely reflecting internal proteinaceous or hemorrhagic content. No follow-up imaging recommended. Note: This recommendation does not apply to premenarchal patients and to those with increased risk (genetic, family history, elevated tumor markers or other high-risk factors) of ovarian cancer. Reference: JACR 2020 Feb; 17(2):248-254 Electronically Signed   By: Davina Poke D.O.   On: 01/15/2021 12:33    Procedures Procedures   Medications Ordered in ED Medications  iohexol (OMNIPAQUE) 350 MG/ML injection 100 mL (100 mLs Intravenous Contrast Given 01/15/21 1200)    ED Course  I have reviewed the triage vital signs and the nursing notes.  Pertinent labs & imaging results that were available during my care of the  patient were reviewed by me and considered in my medical decision making (see chart for details).    MDM Rules/Calculators/A&P                           50 year old female presenting to the emergency department with 5 days of diarrheal illness with associated abdominal cramping.  No nausea or vomiting.  No fevers or chills.  She is of history of previous abdominal hysterectomy and oophorectomy.  Labs obtained in the emergency department significant for CBC without a leukocytosis, anemia or platelet abnormality, CMP without significant electrolyte abnormality, lipase normal, urinalysis without evidence of UTI.  CT of the abdomen pelvis was performed which revealed thickening of the colon within the ascending and proximal transverse colon with some pericolonic fat stranding suggestive of an infectious or inflammatory colitis.  Normal appendix noted on CT.  No evidence of obstructing nephrolithiasis or ureterolithiasis.  Some borderline thickening of the urinary bladder wall.  The patient denies any dysuria, increased urinary frequency or suprapubic discomfort.  Her urinalysis was negative for UTI.  Findings are most consistent with an infectious/inflammatory diarrhea.  We will attempt symptomatic management at home.  Advised the patient continue with oral rehydration and PCP follow-up as needed.  Final Clinical Impression(s) / ED Diagnoses Final diagnoses:  Diarrhea of presumed infectious origin    Rx / DC Orders ED Discharge Orders     None        Regan Lemming, MD 01/16/21 1601

## 2021-01-15 NOTE — ED Notes (Signed)
Patient left ED ambulatory with steady independent gait accompanied by her daughter.

## 2021-01-15 NOTE — ED Triage Notes (Signed)
Patient complains of intermittent right lower quadrant abdominal pain that started several days ago. Patient alert, oriented, and in no apparent distress at this time.

## 2021-01-15 NOTE — ED Provider Notes (Signed)
Emergency Medicine Provider Triage Evaluation Note  Joy [name] , [name] 50 y.o. female  was evaluated in triage.  Pt complains of abd pain. Began yesterday. Saw PCP, sent to ro appy. Nausea no emesis. Located to right abd. Lower>upper. No diarrhea.  Previous abdominal surgeries of hysterectomy including ovaries according to patient.  Interpretor was used  Review of Systems  Positive: Abd pain Negative: Fever, emesis, dysuria, weakness, cough  Physical Exam  BP 129/70 (BP Location: Left Arm)   Pulse (!) 56   Temp 98.1 F (36.7 C) (Oral)   Resp 18   LMP 12/05/2019   SpO2 97%  Gen:   Awake, no distress   Resp:  Normal effort  MSK:   Moves extremities without difficulty  ABD:  Diffusely tender to right abdomen, worse to right lower abdomen Other:    Medical Decision Making  Medically screening exam initiated at 9:12 AM.  Appropriate orders placed.  Joy [name] was informed that the remainder of the evaluation will be completed by another provider, this initial triage assessment does not replace that evaluation, and the importance of remaining in the ED until their evaluation is complete.  Abd pain  Vital signs stable, labs and imaging ordered   Joy [name] A, PA-C 01/15/21 [id number]    Gareth Morgan, MD 01/15/21 1057

## 2022-02-14 IMAGING — CT CT ABD-PELV W/ CM
2 of 5 series · 15 of 46 positions shown, 17 images · IV contrast (APPLIED)
Comparison: None.

CLINICAL DATA: Right lower quadrant abdominal pain

EXAM:
CT ABDOMEN AND PELVIS WITH CONTRAST
TECHNIQUE: Multidetector CT imaging of the abdomen and pelvis was performed
using the standard protocol following bolus administration of
intravenous contrast.
CONTRAST:  100mL OMNIPAQUE IOHEXOL 350 MG/ML SOLN

[Series 3: abdomen 5.0 · axial · 0.84mm/px · z∈[+854,+1224]mm · 12 of 88 slices shown, 14 images]
[im 7/88  soft-tissue]
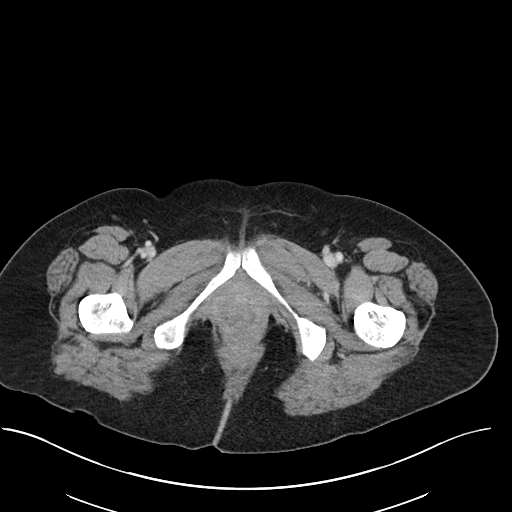
[im 7/88  bone]
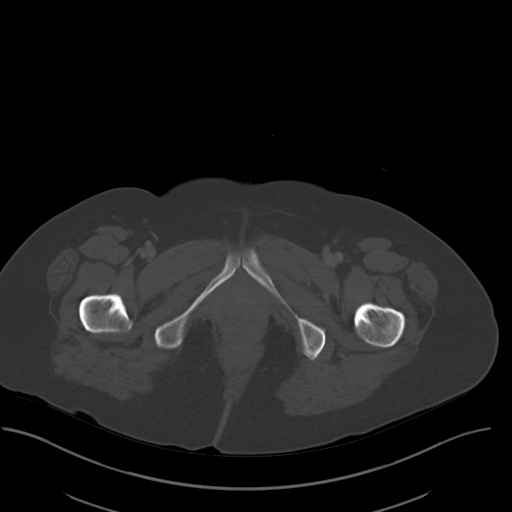
[im 13/88  soft-tissue]
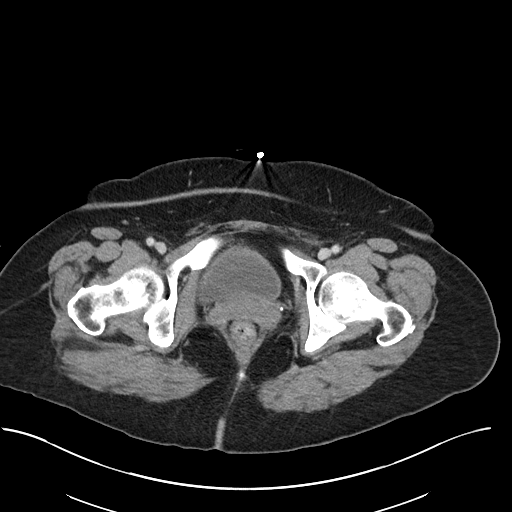
[im 19/88  soft-tissue]
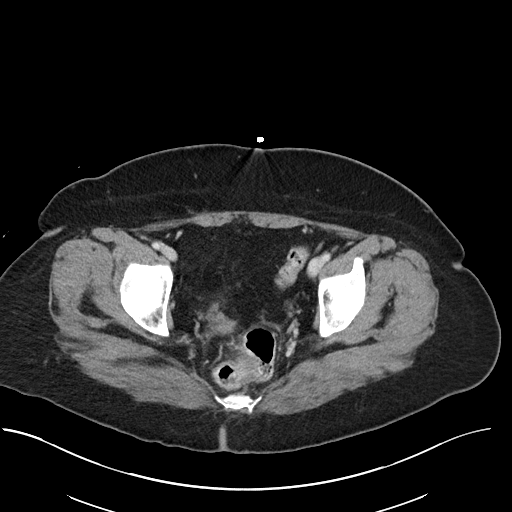
[im 25/88  soft-tissue]
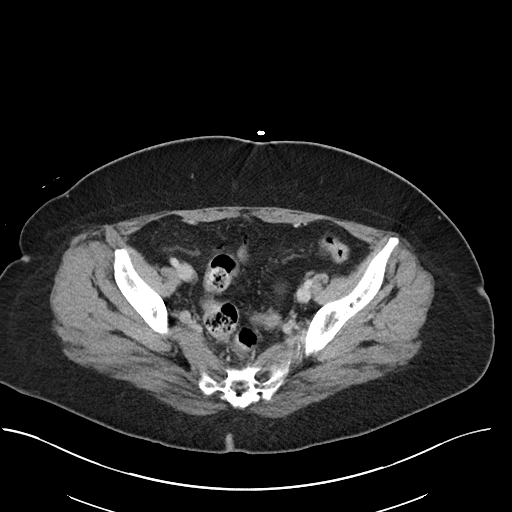
[im 32/88  soft-tissue]
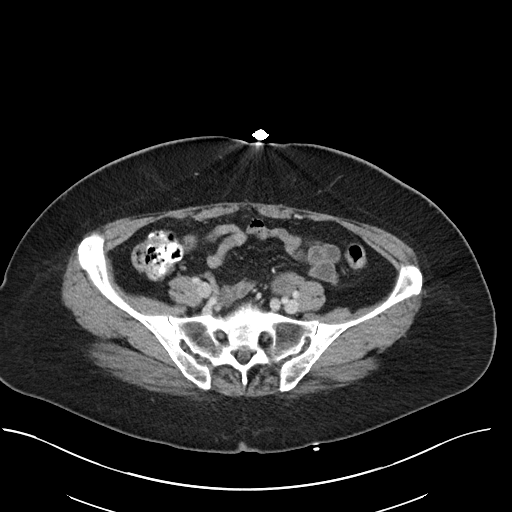
[im 38/88  soft-tissue]
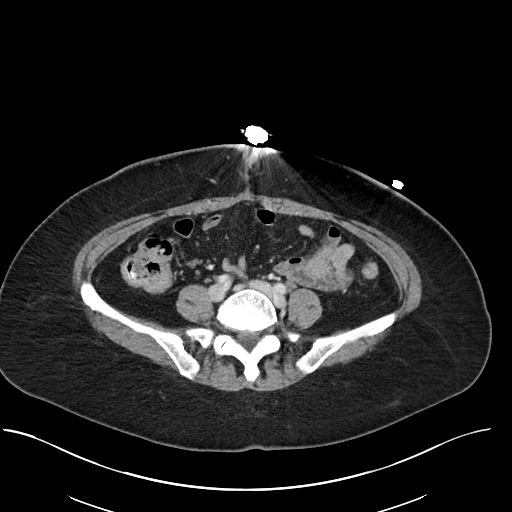
[im 50/88  soft-tissue]
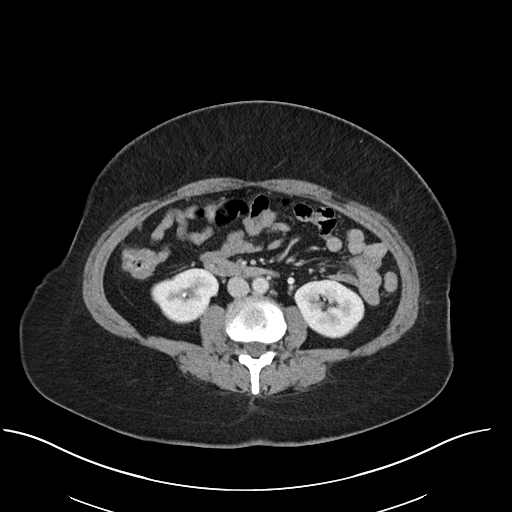
[im 56/88  soft-tissue]
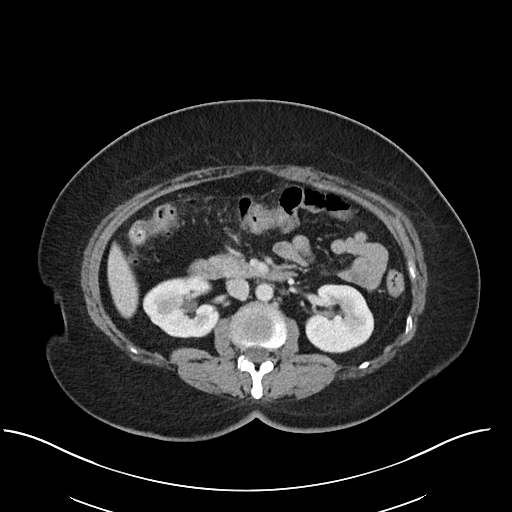
[im 63/88  soft-tissue]
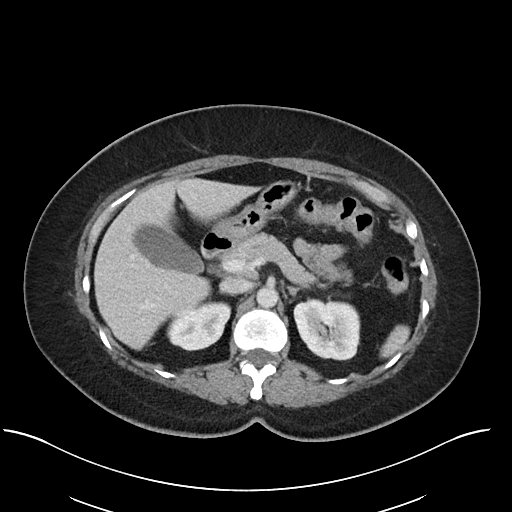
[im 63/88  bone]
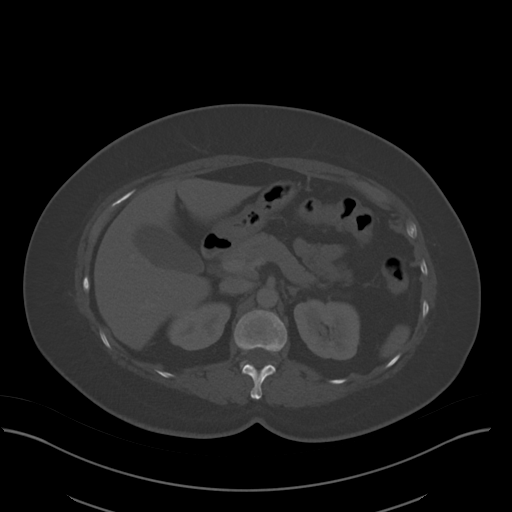
[im 69/88  soft-tissue]
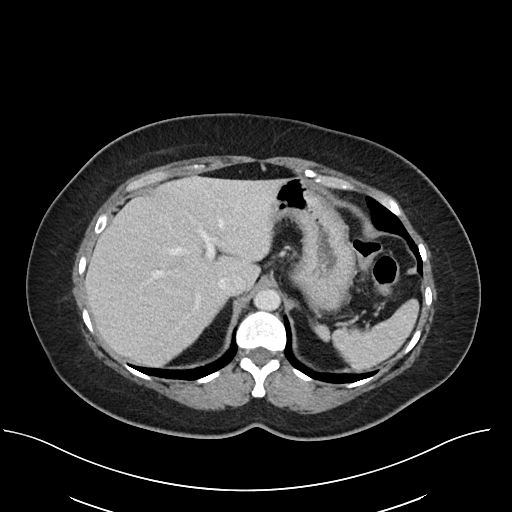
[im 75/88  soft-tissue]
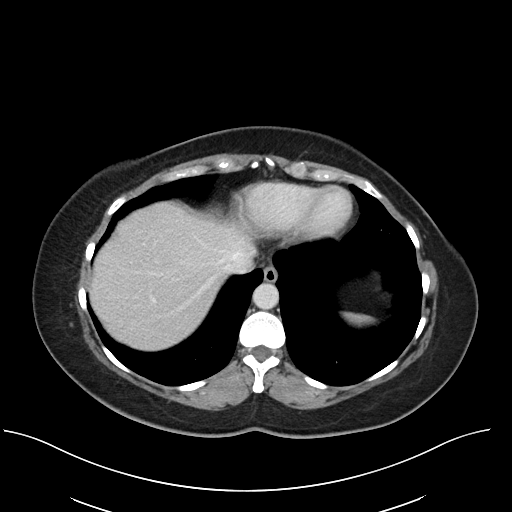
[im 81/88  soft-tissue]
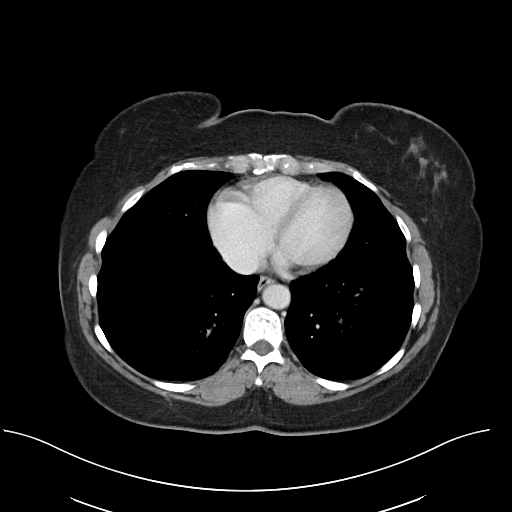

[Series 7: abdomen 3.0 mpr cor · coronal · 0.74mm/px · 3 of 105 slices shown]
[im 35/105  soft-tissue]
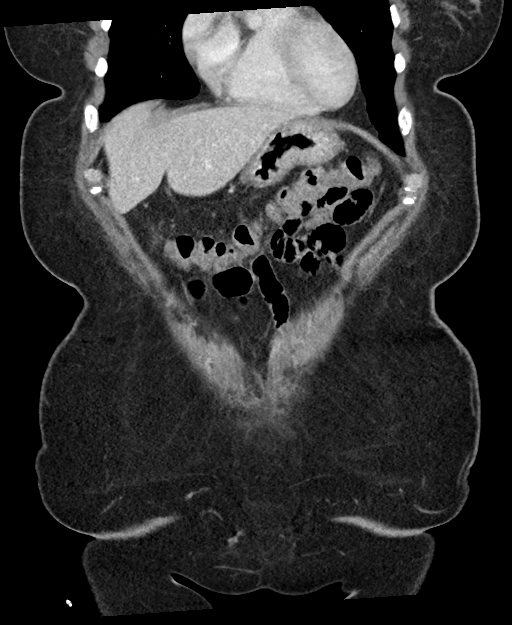
[im 47/105  soft-tissue]
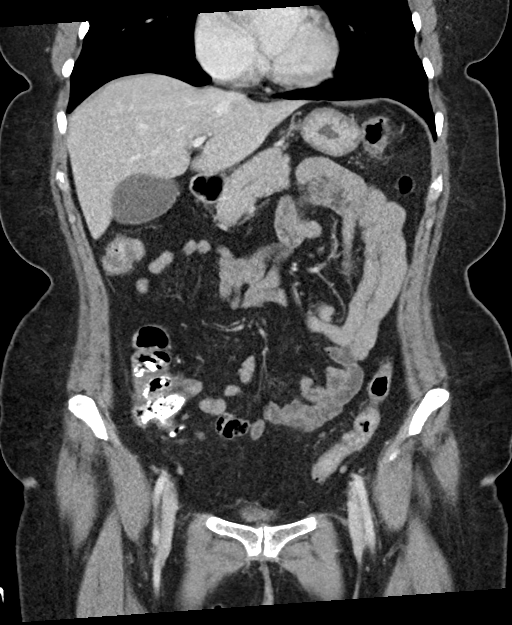
[im 58/105  soft-tissue]
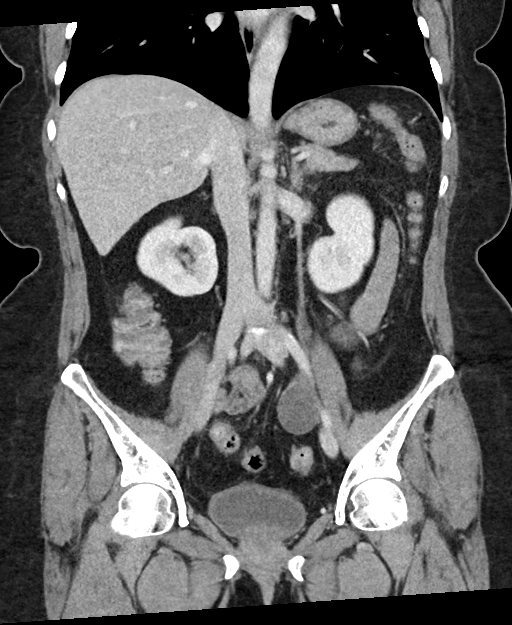

[15 of 46 positions shown; findings below may reference images not displayed]

FINDINGS: Lower chest: Included lung bases are clear.  Heart size is normal.

Hepatobiliary: No focal liver abnormality is seen. No gallstones,
gallbladder wall thickening, or biliary dilatation.

Pancreas: Unremarkable. No pancreatic ductal dilatation or
surrounding inflammatory changes.

Spleen: Normal in size without focal abnormality.

Adrenals/Urinary Tract: Unremarkable adrenal glands. Nonobstructing
2 mm stone within the lower pole of the right kidney. Subcentimeter
probable right renal cyst. Kidneys enhance symmetrically. No
hydronephrosis. Punctate calcifications within the low pelvis
bilaterally likely reflect vascular phleboliths. No definite
ureteral calculi. Borderline thickening of the urinary bladder wall,
which may be accentuated by under distension.

Stomach/Bowel: Stomach is within normal limits. No dilated loops of
small bowel. Unremarkable appearance of the terminal ileum. A normal
appendix is seen in the right lower quadrant. Mild long segment
thickening of the colon, most pronounced within the ascending and
proximal transverse colon where there is subtle pericolonic fat
stranding (series 3, image 35).

Vascular/Lymphatic: No significant vascular findings are present. No
enlarged abdominal or pelvic lymph nodes.

Reproductive: Status post hysterectomy. No right adnexal
abnormality. Ovoid cystic appearing lesion in the left adnexum
measures up to 3.7 cm with internal density greater than expected
for simple fluid.

Other: No free fluid. No abdominopelvic fluid collection. No
pneumoperitoneum. No abdominal wall hernia.

Musculoskeletal: No acute or significant osseous findings.
IMPRESSION: 1. Mild long segment thickening of the colon, most pronounced within
the ascending and proximal transverse colon where there is subtle
pericolonic fat stranding. Findings suggestive of an infectious or
inflammatory colitis.
2. Normal appendix.
3. Nonobstructing 2 mm right renal stone.
4. Borderline thickening of the urinary bladder wall, which may be
accentuated by under distension. Correlate with urinalysis to
exclude cystitis.
5. 3.7 cm slightly hyperdense left adnexal cyst, likely reflecting
internal proteinaceous or hemorrhagic content. No follow-up imaging
recommended. Note: This recommendation does not apply to
premenarchal patients and to those with increased risk (genetic,
family history, elevated tumor markers or other high-risk factors)
of ovarian cancer. Reference: JACR [DATE]):248-254

## 2023-09-05 ENCOUNTER — Other Ambulatory Visit: Payer: Self-pay | Admitting: Nurse Practitioner

## 2023-09-05 DIAGNOSIS — Z1231 Encounter for screening mammogram for malignant neoplasm of breast: Secondary | ICD-10-CM

## 2023-11-08 ENCOUNTER — Other Ambulatory Visit: Payer: Self-pay

## 2023-11-08 ENCOUNTER — Ambulatory Visit: Payer: Self-pay | Admitting: *Deleted

## 2023-11-08 VITALS — BP 140/66 | Wt 179.0 lb

## 2023-11-08 DIAGNOSIS — Z1211 Encounter for screening for malignant neoplasm of colon: Secondary | ICD-10-CM

## 2023-11-08 DIAGNOSIS — Z1239 Encounter for other screening for malignant neoplasm of breast: Secondary | ICD-10-CM

## 2023-11-08 NOTE — Progress Notes (Signed)
 Ms. Joy [name] is a 53 y.o. female who presents to [facility] clinic today with no complaints.    Pap Smear: Pap smear not completed today. Last Pap smear was 07/04/2019 at [facility] clinic and was normal. Per patient has no history of an abnormal Pap smear. Patient has a history of a hysterectomy 12/10/2019 due to fibroids and AUB. Patient doesn't need any further Pap smears due to her history of a hysterectomy for benign reasons per BCCCP and ASCCP guidelines. Last Pap smear result is available in Epic.   Physical exam: Breasts Breasts symmetrical. No skin abnormalities bilateral breasts. No nipple retraction bilateral breasts. No nipple discharge bilateral breasts. No lymphadenopathy. No lumps palpated bilateral breasts. No complaints of pain or tenderness on exam. No complaints of pain or tenderness on exam.      Pelvic/Bimanual Pap is not indicated today per BCCCP guidelines.   Smoking History: Patient has never smoked.   Patient Navigation: Patient education provided. Access to services provided for patient through [place] program. Spanish interpreter [name] from [facility] provided.   Colorectal Cancer Screening: Per patient has never had colonoscopy completed. Patient stated she completed a FIT test given by her PCP at [facility] 09/05/2023 and was negative. No complaints today.    Breast and Cervical Cancer Risk Assessment: Patient does not have family history of breast cancer, known genetic mutations, or radiation treatment to the chest before age 40. Patient does not have history of cervical dysplasia, immunocompromised, or DES exposure in-utero.  Risk Scores as of Encounter on 11/08/2023     [name]           5-year 0.92%   Lifetime 8.17%            Last calculated by Silas, Ansyi K, CMA on 11/08/2023 at  9:29 AM        A: BCCCP exam without pap smear No complaints.  P: Referred patient to [facility] for a screening mammogram.  Appointment scheduled Tuesday, Nov 08, 2023 at 1045.  Stefan Edge, RN 11/08/2023 9:37 AM

## 2023-11-08 NOTE — Patient Instructions (Signed)
 Explained breast self awareness with Kalena Espinosa-Sanchez. Patient did not need a Pap smear today due to patient has a history of a hysterectomy for benign reasons. Let patient know that she doesn't need any further Pap smears due to her history of a hysterectomy for benign reasons. Referred patient to [facility] for a screening mammogram. Appointment scheduled Tuesday, Nov 08, 2023 at 1045. Patient aware of appointment and will be there. Shondrea Espinosa-Sanchez verbalized understanding.  Vang Kraeger, Dela Favor, RN 9:37 AM
